# Patient Record
Sex: Female | Born: 1980 | Race: Black or African American | Hispanic: No | Marital: Married | Smoking: Never smoker
Health system: Southern US, Community
[De-identification: ages and names within clinical notes are randomized; demographics above are authoritative.]

## PROBLEM LIST (undated history)

## (undated) ENCOUNTER — Inpatient Hospital Stay (HOSPITAL_COMMUNITY): Payer: Self-pay

## (undated) DIAGNOSIS — O1410 Severe pre-eclampsia, unspecified trimester: Secondary | ICD-10-CM

## (undated) DIAGNOSIS — R9430 Abnormal result of cardiovascular function study, unspecified: Secondary | ICD-10-CM

## (undated) DIAGNOSIS — N83209 Unspecified ovarian cyst, unspecified side: Secondary | ICD-10-CM

## (undated) DIAGNOSIS — Z789 Other specified health status: Secondary | ICD-10-CM

## (undated) DIAGNOSIS — O1400 Mild to moderate pre-eclampsia, unspecified trimester: Secondary | ICD-10-CM

## (undated) DIAGNOSIS — I5032 Chronic diastolic (congestive) heart failure: Secondary | ICD-10-CM

## (undated) HISTORY — PX: OVARIAN CYST SURGERY: SHX726

## (undated) HISTORY — DX: Chronic diastolic (congestive) heart failure: I50.32

## (undated) HISTORY — DX: Abnormal result of cardiovascular function study, unspecified: R94.30

---

## 2010-04-08 ENCOUNTER — Emergency Department (HOSPITAL_BASED_OUTPATIENT_CLINIC_OR_DEPARTMENT_OTHER): Admission: EM | Admit: 2010-04-08 | Discharge: 2010-04-08 | Payer: Self-pay | Admitting: Emergency Medicine

## 2011-02-01 LAB — URINALYSIS, ROUTINE W REFLEX MICROSCOPIC
Bilirubin Urine: NEGATIVE
Hgb urine dipstick: NEGATIVE
Ketones, ur: NEGATIVE mg/dL
Nitrite: NEGATIVE
Protein, ur: NEGATIVE mg/dL
Specific Gravity, Urine: 1.029 (ref 1.005–1.030)
Urobilinogen, UA: 1 mg/dL (ref 0.0–1.0)

## 2011-02-01 LAB — PREGNANCY, URINE: Preg Test, Ur: NEGATIVE

## 2013-01-02 ENCOUNTER — Inpatient Hospital Stay (HOSPITAL_COMMUNITY)
Admission: AD | Admit: 2013-01-02 | Discharge: 2013-01-03 | Disposition: A | Discharge status: Home / Self Care | Payer: Medicaid Other | Source: Ambulatory Visit | Attending: Obstetrics and Gynecology | Admitting: Obstetrics and Gynecology

## 2013-01-02 DIAGNOSIS — N949 Unspecified condition associated with female genital organs and menstrual cycle: Secondary | ICD-10-CM | POA: Insufficient documentation

## 2013-01-02 DIAGNOSIS — O209 Hemorrhage in early pregnancy, unspecified: Secondary | ICD-10-CM

## 2013-01-02 DIAGNOSIS — O26859 Spotting complicating pregnancy, unspecified trimester: Secondary | ICD-10-CM | POA: Insufficient documentation

## 2013-01-02 HISTORY — DX: Other specified health status: Z78.9

## 2013-01-02 NOTE — MAU Note (Signed)
Pt presents with complaint of spotting (brown/red) off/on for 2 weeks. Denies pain

## 2013-01-03 ENCOUNTER — Encounter (HOSPITAL_COMMUNITY): Payer: Self-pay | Admitting: *Deleted

## 2013-01-03 ENCOUNTER — Inpatient Hospital Stay (HOSPITAL_COMMUNITY): Payer: Medicaid Other

## 2013-01-03 DIAGNOSIS — O4690 Antepartum hemorrhage, unspecified, unspecified trimester: Secondary | ICD-10-CM

## 2013-01-03 LAB — URINALYSIS, ROUTINE W REFLEX MICROSCOPIC
Bilirubin Urine: NEGATIVE
Leukocytes, UA: NEGATIVE
Nitrite: NEGATIVE
Specific Gravity, Urine: 1.02 (ref 1.005–1.030)
Urobilinogen, UA: 0.2 mg/dL (ref 0.0–1.0)
pH: 7 (ref 5.0–8.0)

## 2013-01-03 LAB — GC/CHLAMYDIA PROBE AMP: GC Probe RNA: NEGATIVE

## 2013-01-03 LAB — URINE MICROSCOPIC-ADD ON

## 2013-01-03 LAB — WET PREP, GENITAL

## 2013-01-03 LAB — POCT PREGNANCY, URINE: Preg Test, Ur: POSITIVE — AB

## 2013-01-03 NOTE — MAU Provider Note (Signed)
History     CSN: [id number]  Arrival date and time: 01/02/13 2324   First Provider Initiated Contact with Patient 01/03/13 0013      Chief Complaint  Patient presents with  . Vaginal Bleeding   HPI  Rachael Santiago is a 32 y.o. G1P0. She presents today with vaginal bleeding for about 3 weeks. She states that it is daily, and is only brown and spotty in nature. She states she has some pain, and she rates the pain 3/10.Marland Kitchen She states it is crampy in nature.   Past Medical History  Diagnosis Date  . Medical history non-contributory     Past Surgical History  Procedure Laterality Date  . Ovarian cyst surgery      History reviewed. No pertinent family history.  History  Substance Use Topics  . Smoking status: Never Smoker   . Smokeless tobacco: Never Used  . Alcohol Use: No    Allergies: No Known Allergies  Prescriptions prior to admission  Medication Sig Dispense Refill  . Prenatal Vit-Fe Fumarate-FA (MULTIVITAMIN-PRENATAL) 27-0.8 MG TABS Take 1 tablet by mouth daily.        Review of Systems  Constitutional: Negative for fever.  Eyes: Negative for blurred vision.  Gastrointestinal: Positive for nausea and abdominal pain (cramps). Negative for vomiting, diarrhea and constipation.  Genitourinary: Negative for dysuria, urgency and frequency.  Musculoskeletal: Negative for myalgias.  Neurological: Negative for dizziness and headaches.   Physical Exam   Blood pressure 115/54, pulse 71, temperature 98 F (36.7 C), temperature source Oral, resp. rate 18, height 5\' 3"  (1.6 m), weight 87.998 kg (194 lb), last menstrual period 10/19/2012, SpO2 100.00%.  Physical Exam  Nursing note and vitals reviewed. Constitutional: She is oriented to person, place, and time. She appears well-developed and well-nourished. No distress.  Cardiovascular: Normal rate.   Respiratory: Effort normal.  GI: Soft. She exhibits no distension. There is no tenderness.  Genitourinary:   External:  normal Vagina: small amount of brown blood seen in vault Cervix: pink, smooth, no CMT Uterus: AV.  Neurological: She is alert and oriented to person, place, and time.  Skin: Skin is warm and dry.  Psychiatric: She has a normal mood and affect.    MAU Course  Procedures  Results for orders placed during the hospital encounter of 01/02/13 (from the past 24 hour(s))  URINALYSIS, ROUTINE W REFLEX MICROSCOPIC     Status: Abnormal   Collection Time    01/02/13 11:53 PM      Result Value Range   Color, Urine YELLOW  YELLOW   APPearance CLEAR  CLEAR   Specific Gravity, Urine 1.020  1.005 - 1.030   pH 7.0  5.0 - 8.0   Glucose, UA NEGATIVE  NEGATIVE mg/dL   Hgb urine dipstick LARGE (*) NEGATIVE   Bilirubin Urine NEGATIVE  NEGATIVE   Ketones, ur NEGATIVE  NEGATIVE mg/dL   Protein, ur NEGATIVE  NEGATIVE mg/dL   Urobilinogen, UA 0.2  0.0 - 1.0 mg/dL   Nitrite NEGATIVE  NEGATIVE   Leukocytes, UA NEGATIVE  NEGATIVE  URINE MICROSCOPIC-ADD ON     Status: None   Collection Time    01/02/13 11:53 PM      Result Value Range   Squamous Epithelial / LPF RARE  RARE   WBC, UA 0-2  <3 WBC/hpf   RBC / HPF 0-2  <3 RBC/hpf   Urine-Other MUCOUS PRESENT    POCT PREGNANCY, URINE     Status: Abnormal   Collection Time  01/03/13 12:17 AM      Result Value Range   Preg Test, Ur POSITIVE (*) NEGATIVE  WET PREP, GENITAL     Status: Abnormal   Collection Time    01/03/13 12:30 AM      Result Value Range   Yeast Wet Prep HPF POC NONE SEEN  NONE SEEN   Trich, Wet Prep NONE SEEN  NONE SEEN   Clue Cells Wet Prep HPF POC FEW (*) NONE SEEN   WBC, Wet Prep HPF POC MODERATE (*) NONE SEEN   *RADIOLOGY REPORT*  Clinical Data: Pregnancy, bleeding  OBSTETRIC <14 WK ULTRASOUND  Technique: Transabdominal ultrasound was performed for evaluation  of the gestation as well as the maternal uterus and adnexal  regions.  Comparison: None.  Intrauterine gestational sac: Visualized single intrauterine  gestational  sac.  Yolk sac: Visualized  Embryo: Visualized  Cardiac Activity: Present  Heart Rate: 165 bpm  CRL: 33.1 mm 10 w 2 d [place] [facility]: July 30 2013  Maternal uterus/Adnexae: Unremarkable sonographic appearance of the  uterus and bilateral adnexa. No subchorionic hemorrhage.  IMPRESSION:  Viable single intrauterine gestation.  Original Report Authenticated By: Malachy Moan, M.D.    Assessment and Plan   1. Antepartum bleeding, first trimester    Reviewed first trimester danger signs Start prenatal care as soon as possible  Tawnya Crook 01/03/2013, 1:00 AM

## 2013-01-08 NOTE — MAU Provider Note (Signed)
Attestation of Attending Supervision of Advanced Practitioner (CNM/NP): Evaluation and management procedures were performed by the Advanced Practitioner under my supervision and collaboration.  I have reviewed the Advanced Practitioner's note and chart, and I agree with the management and plan.  Rino Hosea 01/08/2013 8:41 AM

## 2013-03-15 ENCOUNTER — Encounter (HOSPITAL_COMMUNITY): Payer: Self-pay | Admitting: Advanced Practice Midwife

## 2013-03-15 ENCOUNTER — Inpatient Hospital Stay (HOSPITAL_COMMUNITY)
Admission: AD | Admit: 2013-03-15 | Discharge: 2013-03-15 | Disposition: A | Discharge status: Home / Self Care | Payer: Medicaid Other | Source: Ambulatory Visit | Attending: Obstetrics and Gynecology | Admitting: Obstetrics and Gynecology

## 2013-03-15 DIAGNOSIS — O99891 Other specified diseases and conditions complicating pregnancy: Secondary | ICD-10-CM | POA: Insufficient documentation

## 2013-03-15 DIAGNOSIS — K920 Hematemesis: Secondary | ICD-10-CM | POA: Insufficient documentation

## 2013-03-15 DIAGNOSIS — O219 Vomiting of pregnancy, unspecified: Secondary | ICD-10-CM

## 2013-03-15 DIAGNOSIS — O210 Mild hyperemesis gravidarum: Secondary | ICD-10-CM | POA: Insufficient documentation

## 2013-03-15 LAB — URINALYSIS, ROUTINE W REFLEX MICROSCOPIC
Glucose, UA: NEGATIVE mg/dL
Ketones, ur: NEGATIVE mg/dL
Leukocytes, UA: NEGATIVE
Protein, ur: NEGATIVE mg/dL
pH: 6 (ref 5.0–8.0)

## 2013-03-15 LAB — URINE MICROSCOPIC-ADD ON

## 2013-03-15 MED ORDER — ONDANSETRON 8 MG PO TBDP
8.0000 mg | ORAL_TABLET | Freq: Once | ORAL | Status: AC
  Administered 2013-03-15: 8 mg via ORAL
  Filled 2013-03-15: qty 1

## 2013-03-15 MED ORDER — ESOMEPRAZOLE MAGNESIUM 40 MG PO CPDR: 40.0000 mg | DELAYED_RELEASE_CAPSULE | Freq: Every day | ORAL | Status: DC

## 2013-03-15 MED ORDER — FAMOTIDINE-CA CARB-MAG HYDROX 10-800-165 MG PO CHEW: 2.0000 | CHEWABLE_TABLET | Freq: Two times a day (BID) | ORAL | Status: DC | PRN

## 2013-03-15 MED ORDER — ONDANSETRON HCL 4 MG PO TABS: 4.0000 mg | ORAL_TABLET | ORAL | Status: DC | PRN

## 2013-03-15 NOTE — MAU Note (Addendum)
[place]  [place]   SHE WAS PUTTING SHEETS ON BED- SHE BEGAN GAGGING  AND THEN VOMITED.-  SAW BLOOD IN TOILET.     NOONE  ELSE IN HOUSE SICK.    WAS NOT NAUSEATED WHEN PUTTING ON SHEETS- BUT FEELS NAUSEATED NOW.  NO  DIARRHEA.     ATE  AT 730- RICE AND BOURBON CHICKEN AND THEN SHE ATE MORE AT 10PM.    SAYS SHE HAS TROUBLE WITH ACID REFLUX-     TAKES PREVACID- BUT THINKS MAKES IT WORSE.     LAST SEEN LAST AT OFFICE - LAST Monday.  NEXT APPOINTMENT   5-7.  LAST SEX- MORE  THAN 48 HRS AGO.  DOES NOT   HAVE ANY PHENERGAN  OR ZOFRAN FOR NAUSEA-  FROM OFFICE.

## 2013-03-15 NOTE — MAU Provider Note (Signed)
Chief Complaint:  No chief complaint on file.  First Provider Initiated Contact with Patient 03/15/13 0139     HPI: Rachael Santiago is a 32 y.o. G1P0 at [redacted]w[redacted]d who presents to maternity admissions reporting hematemesis x 1 tonight. Ate chicken dinner at ~7:30 pm and again at ~10:00 pm. Felt reflux while leaning over, gagged and vomited in toilet. Small amount of blood in emesis. Denies esophogeal/substernal/epigastric pain, further bleeding, contractions, leakage of fluid or vaginal bleeding. Good fetal movement. Mild nausea persists. Has not taken anything for it. No Hx of hematemesis.   Past Medical History: Past Medical History  Diagnosis Date  . Medical history non-contributory     Past obstetric history: OB History   Grav Para Term Preterm Abortions TAB SAB Ect Mult Living   1              # Outc Date GA Lbr Len/2nd Wgt Sex Del Anes PTL Lv   1 CUR               Past Surgical History: Past Surgical History  Procedure Laterality Date  . Ovarian cyst surgery      Family History: History reviewed. No pertinent family history.  Social History: History  Substance Use Topics  . Smoking status: Never Smoker   . Smokeless tobacco: Never Used  . Alcohol Use: No    Allergies: No Known Allergies  Meds:  Prescriptions prior to admission  Medication Sig Dispense Refill  . Prenatal Vit-Fe Fumarate-FA (MULTIVITAMIN-PRENATAL) 27-0.8 MG TABS Take 1 tablet by mouth daily.        ROS: Pertinent findings in history of present illness.  Physical Exam  Blood pressure 117/70, pulse 77, temperature 98.3 F (36.8 C), temperature source Oral, resp. rate 20, height 5\' 2"  (1.575 m), weight 97.07 kg (214 lb), last menstrual period 10/19/2012. GENERAL: Well-developed, well-nourished female in no acute distress.  HEENT: normocephalic HEART: normal rate RESP: normal effort ABDOMEN: Soft, non-tender, gravid appropriate for gestational age EXTREMITIES: Nontender, no edema NEURO: alert and  oriented SPECULUM EXAM: Deferred    FHT: 145 per doppler.   Labs: Results for orders placed during the hospital encounter of 03/15/13 (from the past 24 hour(s))  URINALYSIS, ROUTINE W REFLEX MICROSCOPIC     Status: Abnormal   Collection Time    03/15/13 12:52 AM      Result Value Range   Color, Urine YELLOW  YELLOW   APPearance CLEAR  CLEAR   Specific Gravity, Urine 1.025  1.005 - 1.030   pH 6.0  5.0 - 8.0   Glucose, UA NEGATIVE  NEGATIVE mg/dL   Hgb urine dipstick SMALL (*) NEGATIVE   Bilirubin Urine NEGATIVE  NEGATIVE   Ketones, ur NEGATIVE  NEGATIVE mg/dL   Protein, ur NEGATIVE  NEGATIVE mg/dL   Urobilinogen, UA 0.2  0.0 - 1.0 mg/dL   Nitrite NEGATIVE  NEGATIVE   Leukocytes, UA NEGATIVE  NEGATIVE  URINE MICROSCOPIC-ADD ON     Status: Abnormal   Collection Time    03/15/13 12:52 AM      Result Value Range   Squamous Epithelial / LPF RARE  RARE   WBC, UA 0-2  <3 WBC/hpf   RBC / HPF 3-6  <3 RBC/hpf   Bacteria, UA FEW (*) RARE    Imaging:  NA  MAU Course: 0150: No Testing needed at this time per Dr. Senaida Ores. Change to Nexium. Zofran given for nausea. No vomiting while in MAU. Tolerating POs.   Assessment: 1. Hematemesis  2. Nausea and vomiting in pregnancy prior to [redacted] weeks gestation    Plan: Discharge home Preterm labor precautions and fetal kick counts.     Follow-up Information   Follow up with BOVARD,JODY, MD In 1 week. (as scheduled)    Contact information:   [address] [place] [address] [phone]       Follow up with [facility]ERN[facility] ADMISS[facility]. (As needed if symptoms worsen)    Contact information:   [address] [place] [address] [phone]       Medication List    TAKE these medications       esomeprazole 40 MG capsule  Commonly known as:  NEXIUM  Take 1 capsule (40 mg total) by mouth daily.     famotidine-calcium carbonate-magnesium hydroxide 10-800-165 MG Chew   Commonly known as:  PEPCID COMPLETE  Chew 2 tablets by mouth 2 (two) times daily as needed.     multivitamin-prenatal 27-0.8 MG Tabs  Take 1 tablet by mouth daily.     ondansetron 4 MG tablet  Commonly known as:  ZOFRAN  Take 1 tablet (4 mg total) by mouth every 4 (four) hours as needed for nausea.        [place], CNM 03/15/2013 1:59 AM

## 2013-05-01 ENCOUNTER — Inpatient Hospital Stay (HOSPITAL_COMMUNITY)
Admission: AD | Admit: 2013-05-01 | Discharge: 2013-05-10 | DRG: 765 | Disposition: A | Discharge status: Home / Self Care | Payer: Medicaid Other | Source: Ambulatory Visit | Attending: Obstetrics and Gynecology | Admitting: Obstetrics and Gynecology

## 2013-05-01 ENCOUNTER — Other Ambulatory Visit: Payer: Self-pay | Admitting: Obstetrics and Gynecology

## 2013-05-01 ENCOUNTER — Inpatient Hospital Stay (HOSPITAL_COMMUNITY): Payer: Medicaid Other

## 2013-05-01 ENCOUNTER — Encounter (HOSPITAL_COMMUNITY): Payer: Self-pay

## 2013-05-01 DIAGNOSIS — D259 Leiomyoma of uterus, unspecified: Secondary | ICD-10-CM | POA: Diagnosis present

## 2013-05-01 DIAGNOSIS — D4959 Neoplasm of unspecified behavior of other genitourinary organ: Secondary | ICD-10-CM | POA: Diagnosis present

## 2013-05-01 DIAGNOSIS — I313 Pericardial effusion (noninflammatory): Secondary | ICD-10-CM

## 2013-05-01 DIAGNOSIS — O1400 Mild to moderate pre-eclampsia, unspecified trimester: Secondary | ICD-10-CM

## 2013-05-01 DIAGNOSIS — IMO0002 Reserved for concepts with insufficient information to code with codable children: Principal | ICD-10-CM | POA: Diagnosis present

## 2013-05-01 DIAGNOSIS — O1413 Severe pre-eclampsia, third trimester: Secondary | ICD-10-CM

## 2013-05-01 DIAGNOSIS — I319 Disease of pericardium, unspecified: Secondary | ICD-10-CM | POA: Diagnosis present

## 2013-05-01 DIAGNOSIS — I340 Nonrheumatic mitral (valve) insufficiency: Secondary | ICD-10-CM

## 2013-05-01 DIAGNOSIS — O34599 Maternal care for other abnormalities of gravid uterus, unspecified trimester: Secondary | ICD-10-CM | POA: Diagnosis present

## 2013-05-01 DIAGNOSIS — I3139 Other pericardial effusion (noninflammatory): Secondary | ICD-10-CM

## 2013-05-01 DIAGNOSIS — I509 Heart failure, unspecified: Secondary | ICD-10-CM

## 2013-05-01 DIAGNOSIS — I059 Rheumatic mitral valve disease, unspecified: Secondary | ICD-10-CM | POA: Diagnosis present

## 2013-05-01 DIAGNOSIS — O1410 Severe pre-eclampsia, unspecified trimester: Secondary | ICD-10-CM

## 2013-05-01 HISTORY — DX: Mild to moderate pre-eclampsia, unspecified trimester: O14.00

## 2013-05-01 HISTORY — DX: Unspecified ovarian cyst, unspecified side: N83.209

## 2013-05-01 HISTORY — DX: Severe pre-eclampsia, unspecified trimester: O14.10

## 2013-05-01 LAB — URINALYSIS, ROUTINE W REFLEX MICROSCOPIC
Bilirubin Urine: NEGATIVE
Ketones, ur: NEGATIVE mg/dL
Leukocytes, UA: NEGATIVE
Nitrite: NEGATIVE
Specific Gravity, Urine: 1.02 (ref 1.005–1.030)
Urobilinogen, UA: 0.2 mg/dL (ref 0.0–1.0)

## 2013-05-01 LAB — COMPREHENSIVE METABOLIC PANEL
ALT: 29 U/L (ref 0–35)
AST: 35 U/L (ref 0–37)
Albumin: 1.9 g/dL — ABNORMAL LOW (ref 3.5–5.2)
Calcium: 8.8 mg/dL (ref 8.4–10.5)
Potassium: 3.8 mEq/L (ref 3.5–5.1)
Sodium: 135 mEq/L (ref 135–145)
Total Protein: 5.1 g/dL — ABNORMAL LOW (ref 6.0–8.3)

## 2013-05-01 LAB — CBC
MCH: 27.7 pg (ref 26.0–34.0)
MCHC: 33.2 g/dL (ref 30.0–36.0)
Platelets: 190 10*3/uL (ref 150–400)

## 2013-05-01 LAB — URINE MICROSCOPIC-ADD ON

## 2013-05-01 MED ORDER — LABETALOL HCL 5 MG/ML IV SOLN
10.0000 mg | INTRAVENOUS | Status: DC | PRN
  Administered 2013-05-01 – 2013-05-02 (×5): 10 mg via INTRAVENOUS
  Filled 2013-05-01 (×2): qty 4

## 2013-05-01 MED ORDER — LACTATED RINGERS IV SOLN
INTRAVENOUS | Status: DC
  Administered 2013-05-01: 20 mL via INTRAVENOUS

## 2013-05-01 MED ORDER — DOCUSATE SODIUM 100 MG PO CAPS
100.0000 mg | ORAL_CAPSULE | Freq: Every day | ORAL | Status: DC
  Administered 2013-05-01 – 2013-05-04 (×4): 100 mg via ORAL
  Filled 2013-05-01 (×5): qty 1

## 2013-05-01 MED ORDER — PRENATAL MULTIVITAMIN CH
1.0000 | ORAL_TABLET | Freq: Every day | ORAL | Status: DC
  Administered 2013-05-02 – 2013-05-04 (×3): 1 via ORAL
  Filled 2013-05-01 (×4): qty 1

## 2013-05-01 MED ORDER — ZOLPIDEM TARTRATE 5 MG PO TABS: 5.0000 mg | ORAL_TABLET | Freq: Every evening | ORAL | Status: DC | PRN

## 2013-05-01 MED ORDER — SODIUM CHLORIDE 0.9 % IV SOLN
Freq: Once | INTRAVENOUS | Status: AC
  Administered 2013-05-01: 16:00:00 via INTRAVENOUS

## 2013-05-01 MED ORDER — BETAMETHASONE SOD PHOS & ACET 6 (3-3) MG/ML IJ SUSP
12.0000 mg | Freq: Once | INTRAMUSCULAR | Status: AC
  Administered 2013-05-01: 12 mg via INTRAMUSCULAR
  Filled 2013-05-01: qty 2

## 2013-05-01 MED ORDER — LABETALOL HCL 5 MG/ML IV SOLN
INTRAVENOUS | Status: AC
  Filled 2013-05-01: qty 4

## 2013-05-01 MED ORDER — CALCIUM CARBONATE ANTACID 500 MG PO CHEW: 2.0000 | CHEWABLE_TABLET | ORAL | Status: DC | PRN

## 2013-05-01 MED ORDER — LABETALOL HCL 5 MG/ML IV SOLN
10.0000 mg | Freq: Once | INTRAVENOUS | Status: AC
  Administered 2013-05-01: 10 mg via INTRAVENOUS
  Filled 2013-05-01: qty 4

## 2013-05-01 NOTE — Progress Notes (Signed)
walidah cnm notified of patient, her elevated bp,. Will see patient ASAP.

## 2013-05-01 NOTE — H&P (Signed)
Rachael Santiago, Rachael Santiago NO.:  [id number]  MEDICAL RECORD NO.:  [id number]  LOCATION:  9164                          FACILITY:  [facility]  PHYSICIAN:  Malachi Pro. Ambrose Mantle, M.D. DATE OF BIRTH:  July 10, 1981  DATE OF ADMISSION:  05/01/2013 DATE OF DISCHARGE:                             HISTORY & PHYSICAL   PRESENT ILLNESS:  This is a 32 year old black female, para 0, gravida 1 at 27 weeks and 1 day gestation, who is admitted with probable preeclampsia.  The patient has a negative hepatitis B surface antigen, positive rubella, O positive blood group and type with a negative antibody screen.  RPR nonreactive.  Quad screen negative.  Pap smear normal.  One-hour Glucola 122.  This patient was late to prenatal care beginning her prenatal care at 19 weeks.  She was having a normal regular prenatal visit today and was found to have 4+ protein in the urine.  Blood pressures were elevated at around 150-158/100.  She was sent to the maternity admission unit where blood pressures were even higher.  Blood labs including SGOT and PT and platelet count were normal.  However, she again showed significant proteinuria.  The blood pressure was treated with labetalol 10 mg IV, it became more normal and she is admitted now for steroid therapy, close monitoring of her blood pressure and NICU consult.  PAST MEDICAL HISTORY:  Reveals no known allergies.  No significant medical illnesses.  OPERATIONS:  She did have laparoscopic removal of an ovarian cyst.  SOCIAL HISTORY:  She does not drink, take drugs or smoke tobacco.  PHYSICAL EXAMINATION:  VITAL SIGNS:  On admission, temperature 98.8, pulse 102, respirations 20, blood pressure 180/102, previous was 150/98, O2 saturation 99. HEART:  Normal size and sounds.  No murmurs. LUNGS:  Clear to auscultation. ABDOMEN:  Fundal height appropriate for gestational age.  Fetal heart tones are normal.  Cervix not examined.  There is 2 to 3+  pretibial edema.  ADMITTING IMPRESSION:  Intrauterine pregnancy, 27 weeks and 1 day, presumed preeclampsia.  The patient is admitted for tight blood pressure control, administration of steroids and NICU consult.     Malachi Pro. Ambrose Mantle, M.D.     TFH/MEDQ  D:  05/01/2013  T:  05/01/2013  Job:  [id number]

## 2013-05-01 NOTE — MAU Note (Signed)
Patient is sent from the office by dr meisinger due to elevated bp. Patient states that this the first time that her bp is high and had protein in her urine. She denies any visual disturbance, epigastric pain, vaginal bleeding, lof or contractions. She reports having a mild 3/10 headache and good fetal movement.

## 2013-05-01 NOTE — MAU Provider Note (Signed)
  History     CSN: [id number]  Arrival date and time: 05/01/13 1526   First Provider Initiated Contact with Patient 05/01/13 1608      Chief Complaint  Patient presents with  . Hypertension   HPI  Pt is a G1P0 sent from office with elevated blood pressure.  Reports headache (frontal) that started two hours ago.  Pt states that it is "mild".  No report or epigastric pain or vision changes.  +fetal movement.  No contractions, leaking of fluid, or vaginal bleeding.    Past Medical History  Diagnosis Date  . Medical history non-contributory   . Ovarian cyst     Past Surgical History  Procedure Laterality Date  . Ovarian cyst surgery      History reviewed. No pertinent family history.  History  Substance Use Topics  . Smoking status: Never Smoker   . Smokeless tobacco: Never Used  . Alcohol Use: No    Allergies: No Known Allergies  Prescriptions prior to admission  Medication Sig Dispense Refill  . famotidine-calcium carbonate-magnesium hydroxide (PEPCID COMPLETE) 10-800-165 MG CHEW Chew 2 tablets by mouth 2 (two) times daily as needed.  60 tablet  4  . ondansetron (ZOFRAN) 4 MG tablet Take 1 tablet (4 mg total) by mouth every 4 (four) hours as needed for nausea.  30 tablet  0  . Prenatal Vit-Fe Fumarate-FA (MULTIVITAMIN-PRENATAL) 27-0.8 MG TABS Take 1 tablet by mouth daily.        Review of Systems  Eyes: Negative.   Gastrointestinal: Negative for abdominal pain.  Neurological: Positive for headaches.  All other systems reviewed and are negative.   Physical Exam   Blood pressure 175/110, pulse 102, temperature 98.7 F (37.1 C), temperature source Oral, resp. rate 18, height 5\' 3"  (1.6 m), weight 105.688 kg (233 lb), last menstrual period 10/19/2012, SpO2 100.00%.  Physical Exam  Constitutional: She is oriented to person, place, and time. She appears well-developed and well-nourished. No distress.  HENT:  Head: Normocephalic.  Eyes: Pupils are equal, round, and  reactive to light.  Neck: Normal range of motion. Neck supple.  Cardiovascular: Normal rate and regular rhythm.   Respiratory: Effort normal and breath sounds normal.  Genitourinary: No bleeding around the vagina.  Musculoskeletal: Normal range of motion. She exhibits edema ( 2+pedal edema).     Neurological: She is alert and oriented to person, place, and time. She has normal reflexes. She displays normal reflexes.  Skin: Skin is warm and dry.    MAU Course  Procedures  [address] > IV Labe 10 mg  Care turned over to Dr. Ambrose Mantle who admits pt for preeclampsia.  Assessment and Plan    [facility] 05/01/2013, 4:10 PM

## 2013-05-02 LAB — CBC WITH DIFFERENTIAL/PLATELET
Eosinophils Relative: 0 % (ref 0–5)
HCT: 30.3 % — ABNORMAL LOW (ref 36.0–46.0)
Lymphocytes Relative: 10 % — ABNORMAL LOW (ref 12–46)
Lymphs Abs: 0.9 10*3/uL (ref 0.7–4.0)
MCV: 83.7 fL (ref 78.0–100.0)
Neutro Abs: 8.1 10*3/uL — ABNORMAL HIGH (ref 1.7–7.7)
Platelets: 192 10*3/uL (ref 150–400)
RBC: 3.62 MIL/uL — ABNORMAL LOW (ref 3.87–5.11)
WBC: 9.1 10*3/uL (ref 4.0–10.5)

## 2013-05-02 LAB — COMPREHENSIVE METABOLIC PANEL
ALT: 30 U/L (ref 0–35)
AST: 39 U/L — ABNORMAL HIGH (ref 0–37)
Alkaline Phosphatase: 96 U/L (ref 39–117)
CO2: 22 mEq/L (ref 19–32)
Calcium: 8.7 mg/dL (ref 8.4–10.5)
Chloride: 105 mEq/L (ref 96–112)
GFR calc Af Amer: >90 mL/min (ref >90)
GFR calc non Af Amer: >90 mL/min (ref >90)
Glucose, Bld: 138 mg/dL — ABNORMAL HIGH (ref 70–99)
Sodium: 135 mEq/L (ref 135–145)
Total Bilirubin: 0.1 mg/dL — ABNORMAL LOW (ref 0.3–1.2)

## 2013-05-02 LAB — OB RESULTS CONSOLE HEPATITIS B SURFACE ANTIGEN: Hepatitis B Surface Ag: NEGATIVE

## 2013-05-02 LAB — URINE CULTURE

## 2013-05-02 LAB — OB RESULTS CONSOLE ABO/RH: RH Type: POSITIVE

## 2013-05-02 LAB — OB RESULTS CONSOLE RUBELLA ANTIBODY, IGM: Rubella: IMMUNE

## 2013-05-02 LAB — OB RESULTS CONSOLE ANTIBODY SCREEN: Antibody Screen: NEGATIVE

## 2013-05-02 LAB — OB RESULTS CONSOLE RPR: RPR: NONREACTIVE

## 2013-05-02 MED ORDER — LABETALOL HCL 100 MG PO TABS
100.0000 mg | ORAL_TABLET | Freq: Two times a day (BID) | ORAL | Status: DC
  Administered 2013-05-02 – 2013-05-05 (×7): 100 mg via ORAL
  Filled 2013-05-02 (×9): qty 1

## 2013-05-02 MED ORDER — BETAMETHASONE SOD PHOS & ACET 6 (3-3) MG/ML IJ SUSP
12.0000 mg | Freq: Once | INTRAMUSCULAR | Status: AC
  Administered 2013-05-02: 12 mg via INTRAMUSCULAR
  Filled 2013-05-02: qty 2

## 2013-05-02 NOTE — Progress Notes (Signed)
Feels ok Afeb, VSS, BP 140-150/80-90 today on PO labetalol FHT- reassuring when on monitor Will continue Labetalol, recheck labs in am, finish 24 hour urine collection, second BMZ tonight.  Patient is aware that her status could change any day and we may need to proceed with delivery.

## 2013-05-02 NOTE — Plan of Care (Signed)
Problem: Consults Goal: Birthing Suites Patient Information Press F2 to bring up selections list  Outcome: Completed/Met Date Met:  05/02/13  Pt < [redacted] weeks EGA

## 2013-05-02 NOTE — Progress Notes (Signed)
N Dr. Jackelyn Knife of difficulty obtaining cont FHR tracing despite mult attempts by different RNs. Also made pt aware becoming increasingly unhappy about positioning req to obtain tracing. Will now allow pt to be NST qshift

## 2013-05-02 NOTE — Progress Notes (Signed)
Patient ID: Rachael Santiago, female   DOB: 1981/09/09, 32 y.o.   MRN: [id number] 24 hour urine in progress She has received the first dose of steroids. BP controlled with intermittent doses of labetalol. [place] suggests 1 pound 15 ounce fetus that is vertex and AFI subjectively normal Pt has no headache

## 2013-05-02 NOTE — Progress Notes (Signed)
Pt req to wait until after food comes and she has eaten before monitoring. Will call out when ready.

## 2013-05-02 NOTE — Consult Note (Signed)
Asked by Dr Ambrose Mantle to speak to Rachael Santiago re: prenatal consult at 27 weeks with suspected preeclampsia. Chart reviewed. Briefly, she is at [redacted] wks gestation with elevated BP on labetalol.  She has received her second dose of betamethasone tonight. EFW 862 gms, 25%.  Rachael Santiago's mom was present in the room. According to her, Rachael Santiago was a preterm ~29 wks but can't remember BW and complications.  I spoke to Rachael Santiago and discussed resuscitation, general outcome and morbidities around this gestation. Discussed RDS, vent support, nutritional support, immune system immaturity and GI immaturity. Discussed umbilical lines, HAL,  temp support, and LOS.  I also discussed benefits of breastfeeding which Rachael Santiago would like to do.  Thank you for this consult  Total time: 40 min including 35 min face to face.  [name] Q

## 2013-05-02 NOTE — Progress Notes (Signed)
Ask pt about monitoring, req to wait until vistors leave

## 2013-05-03 ENCOUNTER — Encounter (HOSPITAL_COMMUNITY): Payer: Self-pay | Admitting: Obstetrics and Gynecology

## 2013-05-03 DIAGNOSIS — O1410 Severe pre-eclampsia, unspecified trimester: Secondary | ICD-10-CM

## 2013-05-03 DIAGNOSIS — O1400 Mild to moderate pre-eclampsia, unspecified trimester: Secondary | ICD-10-CM

## 2013-05-03 HISTORY — DX: Mild to moderate pre-eclampsia, unspecified trimester: O14.00

## 2013-05-03 HISTORY — DX: Severe pre-eclampsia, unspecified trimester: O14.10

## 2013-05-03 LAB — COMPREHENSIVE METABOLIC PANEL
AST: 39 U/L — ABNORMAL HIGH (ref 0–37)
BUN: 17 mg/dL (ref 6–23)
CO2: 21 mEq/L (ref 19–32)
Chloride: 107 mEq/L (ref 96–112)
Creatinine, Ser: 0.84 mg/dL (ref 0.50–1.10)
GFR calc Af Amer: >90 mL/min (ref >90)
GFR calc non Af Amer: >90 mL/min (ref >90)
Glucose, Bld: 148 mg/dL — ABNORMAL HIGH (ref 70–99)
Total Bilirubin: 0.1 mg/dL — ABNORMAL LOW (ref 0.3–1.2)

## 2013-05-03 LAB — CREATININE CLEARANCE, URINE, 24 HOUR
Collection Interval-CRCL: 24 hours
Creatinine Clearance: 120 mL/min — ABNORMAL HIGH (ref 75–115)
Urine Total Volume-CRCL: 525 mL

## 2013-05-03 LAB — CBC
HCT: 28.1 % — ABNORMAL LOW (ref 36.0–46.0)
Hemoglobin: 9.4 g/dL — ABNORMAL LOW (ref 12.0–15.0)
MCV: 84.6 fL (ref 78.0–100.0)
RBC: 3.32 MIL/uL — ABNORMAL LOW (ref 3.87–5.11)
WBC: 16.6 10*3/uL — ABNORMAL HIGH (ref 4.0–10.5)

## 2013-05-03 LAB — PROTEIN, URINE, 24 HOUR
Protein, 24H Urine: 14264 mg/d — ABNORMAL HIGH (ref 50–100)
Protein, Urine: 2717 mg/dL

## 2013-05-03 NOTE — Progress Notes (Addendum)
Patient ID: Rachael Santiago, female   DOB: [birth year], 32 y.o.   MRN: [id number]   On review, 14gm protein. Severe PreE Will monitor closely  D/W MFM, will wait for BP changes, lab changes, etc.  Daily CBC, CMP monitor LFTs and Plts and Cr.

## 2013-05-03 NOTE — Progress Notes (Signed)
Patient ID: Rachael Santiago, female   DOB: [birth year], 32 y.o.   MRN: [id number]  No c/o's.  +FM, no LOF, no VB, occ ctx, no PIH sx's  AF VSS  Gen NAD Abd soft, FNT FHTs 150's, mod var toco none  24hr urine 2.7gm protein S/p bmz x 2 Cont close monitoring Thanks to NICU for consult daily CMP, CBC - mildly elevated AST D/w pt POC

## 2013-05-04 ENCOUNTER — Encounter (HOSPITAL_COMMUNITY): Payer: Self-pay | Admitting: *Deleted

## 2013-05-04 LAB — COMPREHENSIVE METABOLIC PANEL
AST: 32 U/L (ref 0–37)
Albumin: 1.6 g/dL — ABNORMAL LOW (ref 3.5–5.2)
Alkaline Phosphatase: 85 U/L (ref 39–117)
BUN: 16 mg/dL (ref 6–23)
Chloride: 108 mEq/L (ref 96–112)
Potassium: 4.7 mEq/L (ref 3.5–5.1)
Total Bilirubin: 0.1 mg/dL — ABNORMAL LOW (ref 0.3–1.2)

## 2013-05-04 LAB — TYPE AND SCREEN
ABO/RH(D): O POS
Antibody Screen: NEGATIVE
Antibody Screen: NEGATIVE

## 2013-05-04 LAB — CBC WITH DIFFERENTIAL/PLATELET
Basophils Absolute: 0 10*3/uL (ref 0.0–0.1)
Basophils Relative: 0 % (ref 0–1)
Hemoglobin: 9 g/dL — ABNORMAL LOW (ref 12.0–15.0)
MCHC: 32.4 g/dL (ref 30.0–36.0)
Monocytes Relative: 10 % (ref 3–12)
Neutro Abs: 11.5 10*3/uL — ABNORMAL HIGH (ref 1.7–7.7)
Neutrophils Relative %: 74 % (ref 43–77)
WBC: 15.7 10*3/uL — ABNORMAL HIGH (ref 4.0–10.5)

## 2013-05-04 LAB — CULTURE, BETA STREP (GROUP B ONLY)

## 2013-05-04 NOTE — Progress Notes (Signed)
Patient ID: Rachael Santiago, female   DOB: 09/27/1981, 32 y.o.   MRN: [id number]  No c/o's.  +FM, no LOF, no VB, occ ctx; no PIH sx's  AF VSS (BP 130-160/90's) gen NAD Abd soft, NT FHTs strip q shift, 140-150's mod var  S/p BMZ 24hr urine with 14 gm protein Close monitoring

## 2013-05-05 ENCOUNTER — Inpatient Hospital Stay (HOSPITAL_COMMUNITY): Payer: Medicaid Other

## 2013-05-05 ENCOUNTER — Inpatient Hospital Stay (HOSPITAL_COMMUNITY): Payer: Medicaid Other | Admitting: Anesthesiology

## 2013-05-05 ENCOUNTER — Encounter (HOSPITAL_COMMUNITY)
Admission: AD | Disposition: A | Discharge status: Home / Self Care | Payer: Self-pay | Source: Ambulatory Visit | Attending: Obstetrics and Gynecology

## 2013-05-05 ENCOUNTER — Encounter (HOSPITAL_COMMUNITY): Payer: Self-pay | Admitting: Anesthesiology

## 2013-05-05 DIAGNOSIS — I319 Disease of pericardium, unspecified: Secondary | ICD-10-CM

## 2013-05-05 DIAGNOSIS — IMO0002 Reserved for concepts with insufficient information to code with codable children: Secondary | ICD-10-CM

## 2013-05-05 DIAGNOSIS — I313 Pericardial effusion (noninflammatory): Secondary | ICD-10-CM

## 2013-05-05 DIAGNOSIS — I509 Heart failure, unspecified: Secondary | ICD-10-CM

## 2013-05-05 DIAGNOSIS — I059 Rheumatic mitral valve disease, unspecified: Secondary | ICD-10-CM

## 2013-05-05 DIAGNOSIS — I3139 Other pericardial effusion (noninflammatory): Secondary | ICD-10-CM

## 2013-05-05 DIAGNOSIS — I340 Nonrheumatic mitral (valve) insufficiency: Secondary | ICD-10-CM

## 2013-05-05 LAB — COMPREHENSIVE METABOLIC PANEL
ALT: 25 U/L (ref 0–35)
AST: 34 U/L (ref 0–37)
Albumin: 1.6 g/dL — ABNORMAL LOW (ref 3.5–5.2)
Calcium: 8 mg/dL — ABNORMAL LOW (ref 8.4–10.5)
Creatinine, Ser: 0.74 mg/dL (ref 0.50–1.10)
GFR calc non Af Amer: >90 mL/min (ref >90)
Sodium: 137 mEq/L (ref 135–145)
Total Protein: 4.1 g/dL — ABNORMAL LOW (ref 6.0–8.3)

## 2013-05-05 LAB — CBC WITH DIFFERENTIAL/PLATELET
Basophils Absolute: 0 10*3/uL (ref 0.0–0.1)
Basophils Relative: 0 % (ref 0–1)
Eosinophils Absolute: 0 10*3/uL (ref 0.0–0.7)
Eosinophils Relative: 0 % (ref 0–5)
Lymphs Abs: 2.6 10*3/uL (ref 0.7–4.0)
MCH: 28.3 pg (ref 26.0–34.0)
MCHC: 33.2 g/dL (ref 30.0–36.0)
MCV: 85.3 fL (ref 78.0–100.0)
Neutrophils Relative %: 71 % (ref 43–77)
Platelets: 187 10*3/uL (ref 150–400)
RBC: 3.39 MIL/uL — ABNORMAL LOW (ref 3.87–5.11)
RDW: 15 % (ref 11.5–15.5)

## 2013-05-05 LAB — CBC
MCV: 85.2 fL (ref 78.0–100.0)
Platelets: 185 10*3/uL (ref 150–400)
RDW: 15.1 % (ref 11.5–15.5)
WBC: 14.8 10*3/uL — ABNORMAL HIGH (ref 4.0–10.5)

## 2013-05-05 SURGERY — Surgical Case
Anesthesia: Epidural | Site: Abdomen | Wound class: Clean Contaminated

## 2013-05-05 MED ORDER — TETANUS-DIPHTH-ACELL PERTUSSIS 5-2.5-18.5 LF-MCG/0.5 IM SUSP
0.5000 mL | Freq: Once | INTRAMUSCULAR | Status: DC
  Filled 2013-05-05: qty 0.5

## 2013-05-05 MED ORDER — ONDANSETRON HCL 4 MG PO TABS: 4.0000 mg | ORAL_TABLET | ORAL | Status: DC | PRN

## 2013-05-05 MED ORDER — NALOXONE HCL 1 MG/ML IJ SOLN: 1.0000 ug/kg/h | INTRAVENOUS | Status: DC | PRN

## 2013-05-05 MED ORDER — OXYTOCIN 10 UNIT/ML IJ SOLN
INTRAMUSCULAR | Status: AC
  Filled 2013-05-05: qty 2

## 2013-05-05 MED ORDER — ONDANSETRON HCL 4 MG/2ML IJ SOLN
INTRAMUSCULAR | Status: AC
  Filled 2013-05-05: qty 2

## 2013-05-05 MED ORDER — LABETALOL HCL 5 MG/ML IV SOLN
INTRAVENOUS | Status: AC
  Filled 2013-05-05: qty 8

## 2013-05-05 MED ORDER — FUROSEMIDE 10 MG/ML IJ SOLN
40.0000 mg | Freq: Once | INTRAMUSCULAR | Status: DC
  Filled 2013-05-05: qty 4

## 2013-05-05 MED ORDER — BENZOCAINE-MENTHOL 20-0.5 % EX AERO: 1.0000 "application " | INHALATION_SPRAY | CUTANEOUS | Status: DC | PRN

## 2013-05-05 MED ORDER — SIMETHICONE 80 MG PO CHEW
80.0000 mg | CHEWABLE_TABLET | Freq: Three times a day (TID) | ORAL | Status: DC
  Administered 2013-05-05 – 2013-05-09 (×14): 80 mg via ORAL

## 2013-05-05 MED ORDER — SENNOSIDES-DOCUSATE SODIUM 8.6-50 MG PO TABS: 2.0000 | ORAL_TABLET | Freq: Every day | ORAL | Status: DC

## 2013-05-05 MED ORDER — LACTATED RINGERS IV SOLN: INTRAVENOUS | Status: DC

## 2013-05-05 MED ORDER — ONDANSETRON HCL 4 MG/2ML IJ SOLN
INTRAMUSCULAR | Status: DC | PRN
  Administered 2013-05-05: 4 mg via INTRAVENOUS

## 2013-05-05 MED ORDER — LACTATED RINGERS IV SOLN
INTRAVENOUS | Status: DC
  Administered 2013-05-07: 03:00:00 via INTRAVENOUS

## 2013-05-05 MED ORDER — DIBUCAINE 1 % RE OINT: 1.0000 "application " | TOPICAL_OINTMENT | RECTAL | Status: DC | PRN

## 2013-05-05 MED ORDER — MAGNESIUM SULFATE BOLUS VIA INFUSION
4.0000 g | Freq: Once | INTRAVENOUS | Status: AC
  Administered 2013-05-05: 4 g via INTRAVENOUS
  Filled 2013-05-05: qty 500

## 2013-05-05 MED ORDER — MAGNESIUM SULFATE 40 G IN LACTATED RINGERS - SIMPLE
2.0000 g/h | INTRAVENOUS | Status: DC
  Administered 2013-05-05 – 2013-05-06 (×2): 2 g/h via INTRAVENOUS
  Filled 2013-05-05 (×2): qty 500

## 2013-05-05 MED ORDER — SODIUM CHLORIDE 0.9 % IJ SOLN: 3.0000 mL | INTRAMUSCULAR | Status: DC | PRN

## 2013-05-05 MED ORDER — ONDANSETRON HCL 4 MG/2ML IJ SOLN: 4.0000 mg | INTRAMUSCULAR | Status: DC | PRN

## 2013-05-05 MED ORDER — SCOPOLAMINE 1 MG/3DAYS TD PT72: 1.0000 | MEDICATED_PATCH | Freq: Once | TRANSDERMAL | Status: AC

## 2013-05-05 MED ORDER — OXYTOCIN 10 UNIT/ML IJ SOLN
40.0000 [IU] | INTRAVENOUS | Status: DC | PRN
  Administered 2013-05-05: 40 [IU] via INTRAVENOUS

## 2013-05-05 MED ORDER — SODIUM BICARBONATE 8.4 % IV SOLN
INTRAVENOUS | Status: DC | PRN
  Administered 2013-05-05: 3 mL via EPIDURAL

## 2013-05-05 MED ORDER — DIPHENHYDRAMINE HCL 50 MG/ML IJ SOLN: 25.0000 mg | INTRAMUSCULAR | Status: DC | PRN

## 2013-05-05 MED ORDER — ZOLPIDEM TARTRATE 5 MG PO TABS: 5.0000 mg | ORAL_TABLET | Freq: Every evening | ORAL | Status: DC | PRN

## 2013-05-05 MED ORDER — DIPHENHYDRAMINE HCL 50 MG/ML IJ SOLN: 12.5000 mg | INTRAMUSCULAR | Status: DC | PRN

## 2013-05-05 MED ORDER — WITCH HAZEL-GLYCERIN EX PADS: 1.0000 "application " | MEDICATED_PAD | CUTANEOUS | Status: DC | PRN

## 2013-05-05 MED ORDER — PHENYLEPHRINE 40 MCG/ML (10ML) SYRINGE FOR IV PUSH (FOR BLOOD PRESSURE SUPPORT)
PREFILLED_SYRINGE | INTRAVENOUS | Status: AC
  Filled 2013-05-05: qty 5

## 2013-05-05 MED ORDER — CITRIC ACID-SODIUM CITRATE 334-500 MG/5ML PO SOLN
ORAL | Status: AC
  Filled 2013-05-05: qty 15

## 2013-05-05 MED ORDER — NALBUPHINE HCL 10 MG/ML IJ SOLN
5.0000 mg | INTRAMUSCULAR | Status: DC | PRN
  Filled 2013-05-05: qty 1

## 2013-05-05 MED ORDER — SIMETHICONE 80 MG PO CHEW: 80.0000 mg | CHEWABLE_TABLET | ORAL | Status: DC | PRN

## 2013-05-05 MED ORDER — MEPERIDINE HCL 25 MG/ML IJ SOLN: 6.2500 mg | INTRAMUSCULAR | Status: DC | PRN

## 2013-05-05 MED ORDER — MORPHINE SULFATE (PF) 0.5 MG/ML IJ SOLN
INTRAMUSCULAR | Status: DC | PRN
  Administered 2013-05-05: 4 mg via EPIDURAL

## 2013-05-05 MED ORDER — FAMOTIDINE 20 MG PO TABS: 20.0000 mg | ORAL_TABLET | Freq: Two times a day (BID) | ORAL | Status: DC | PRN

## 2013-05-05 MED ORDER — TETANUS-DIPHTH-ACELL PERTUSSIS 5-2.5-18.5 LF-MCG/0.5 IM SUSP
0.5000 mL | Freq: Once | INTRAMUSCULAR | Status: AC
  Administered 2013-05-06: 0.5 mL via INTRAMUSCULAR
  Filled 2013-05-05: qty 0.5

## 2013-05-05 MED ORDER — LABETALOL HCL 5 MG/ML IV SOLN
INTRAVENOUS | Status: AC
  Administered 2013-05-05: 10 mg via INTRAVENOUS
  Filled 2013-05-05: qty 4

## 2013-05-05 MED ORDER — SODIUM CHLORIDE 0.9 % IR SOLN
Status: DC | PRN
  Administered 2013-05-05: 1000 mL

## 2013-05-05 MED ORDER — OXYTOCIN 40 UNITS IN LACTATED RINGERS INFUSION - SIMPLE MED: 62.5000 mL/h | INTRAVENOUS | Status: AC

## 2013-05-05 MED ORDER — SODIUM CHLORIDE 0.9 % IJ SOLN
INTRAMUSCULAR | Status: AC
  Filled 2013-05-05: qty 3

## 2013-05-05 MED ORDER — FENTANYL CITRATE 0.05 MG/ML IJ SOLN
INTRAMUSCULAR | Status: AC
  Administered 2013-05-05: 50 ug via INTRAVENOUS
  Filled 2013-05-05: qty 2

## 2013-05-05 MED ORDER — MEASLES, MUMPS & RUBELLA VAC ~~LOC~~ INJ
0.5000 mL | INJECTION | Freq: Once | SUBCUTANEOUS | Status: DC
  Filled 2013-05-05: qty 0.5

## 2013-05-05 MED ORDER — SCOPOLAMINE 1 MG/3DAYS TD PT72
MEDICATED_PATCH | TRANSDERMAL | Status: AC
  Administered 2013-05-05: 1.5 mg via TRANSDERMAL
  Filled 2013-05-05: qty 1

## 2013-05-05 MED ORDER — METOCLOPRAMIDE HCL 5 MG/ML IJ SOLN: 10.0000 mg | Freq: Three times a day (TID) | INTRAMUSCULAR | Status: DC | PRN

## 2013-05-05 MED ORDER — FAMOTIDINE-CA CARB-MAG HYDROX 10-800-165 MG PO CHEW: 2.0000 | CHEWABLE_TABLET | Freq: Two times a day (BID) | ORAL | Status: DC | PRN

## 2013-05-05 MED ORDER — LABETALOL HCL 5 MG/ML IV SOLN
40.0000 mg | Freq: Once | INTRAVENOUS | Status: AC
  Administered 2013-05-05: 40 mg via INTRAVENOUS

## 2013-05-05 MED ORDER — LACTATED RINGERS IV SOLN
INTRAVENOUS | Status: DC | PRN
  Administered 2013-05-05: 17:00:00 via INTRAVENOUS

## 2013-05-05 MED ORDER — LANOLIN HYDROUS EX OINT: 1.0000 "application " | TOPICAL_OINTMENT | CUTANEOUS | Status: DC | PRN

## 2013-05-05 MED ORDER — DIPHENHYDRAMINE HCL 25 MG PO CAPS: 25.0000 mg | ORAL_CAPSULE | Freq: Four times a day (QID) | ORAL | Status: DC | PRN

## 2013-05-05 MED ORDER — IBUPROFEN 600 MG PO TABS
600.0000 mg | ORAL_TABLET | Freq: Four times a day (QID) | ORAL | Status: DC
  Administered 2013-05-05 – 2013-05-10 (×18): 600 mg via ORAL
  Filled 2013-05-05 (×18): qty 1

## 2013-05-05 MED ORDER — IBUPROFEN 600 MG PO TABS: 600.0000 mg | ORAL_TABLET | Freq: Four times a day (QID) | ORAL | Status: DC

## 2013-05-05 MED ORDER — FENTANYL CITRATE 0.05 MG/ML IJ SOLN: 25.0000 ug | INTRAMUSCULAR | Status: DC | PRN

## 2013-05-05 MED ORDER — PRENATAL MULTIVITAMIN CH
1.0000 | ORAL_TABLET | Freq: Every day | ORAL | Status: DC
  Administered 2013-05-06 – 2013-05-09 (×4): 1 via ORAL
  Filled 2013-05-05 (×3): qty 1

## 2013-05-05 MED ORDER — NALOXONE HCL 0.4 MG/ML IJ SOLN: 0.4000 mg | INTRAMUSCULAR | Status: DC | PRN

## 2013-05-05 MED ORDER — DIPHENHYDRAMINE HCL 25 MG PO CAPS
25.0000 mg | ORAL_CAPSULE | Freq: Four times a day (QID) | ORAL | Status: DC | PRN
  Administered 2013-05-06: 25 mg via ORAL
  Filled 2013-05-05 (×2): qty 1

## 2013-05-05 MED ORDER — FUROSEMIDE 10 MG/ML IJ SOLN
10.0000 mg | Freq: Once | INTRAMUSCULAR | Status: AC
  Administered 2013-05-05: 10 mg via INTRAVENOUS
  Filled 2013-05-05: qty 1

## 2013-05-05 MED ORDER — LANOLIN HYDROUS EX OINT: TOPICAL_OINTMENT | CUTANEOUS | Status: DC | PRN

## 2013-05-05 MED ORDER — OXYCODONE-ACETAMINOPHEN 5-325 MG PO TABS
1.0000 | ORAL_TABLET | ORAL | Status: DC | PRN
  Administered 2013-05-06 – 2013-05-08 (×3): 1 via ORAL
  Filled 2013-05-05 (×3): qty 1

## 2013-05-05 MED ORDER — OXYCODONE-ACETAMINOPHEN 5-325 MG PO TABS: 1.0000 | ORAL_TABLET | ORAL | Status: DC | PRN

## 2013-05-05 MED ORDER — MORPHINE SULFATE 0.5 MG/ML IJ SOLN
INTRAMUSCULAR | Status: AC
  Filled 2013-05-05: qty 10

## 2013-05-05 MED ORDER — DEXTROSE 5 % IV SOLN
3.0000 g | Freq: Once | INTRAVENOUS | Status: AC
  Administered 2013-05-05: 3 g via INTRAVENOUS
  Filled 2013-05-05: qty 3000

## 2013-05-05 MED ORDER — DIPHENHYDRAMINE HCL 25 MG PO CAPS
25.0000 mg | ORAL_CAPSULE | ORAL | Status: DC | PRN
  Administered 2013-05-06: 25 mg via ORAL

## 2013-05-05 MED ORDER — MENTHOL 3 MG MT LOZG: 1.0000 | LOZENGE | OROMUCOSAL | Status: DC | PRN

## 2013-05-05 MED ORDER — CITRIC ACID-SODIUM CITRATE 334-500 MG/5ML PO SOLN
30.0000 mL | Freq: Once | ORAL | Status: AC
  Administered 2013-05-05: 30 mL via ORAL

## 2013-05-05 MED ORDER — ONDANSETRON HCL 4 MG/2ML IJ SOLN: 4.0000 mg | Freq: Three times a day (TID) | INTRAMUSCULAR | Status: DC | PRN

## 2013-05-05 MED ORDER — LABETALOL HCL 5 MG/ML IV SOLN
10.0000 mg | INTRAVENOUS | Status: AC | PRN
  Administered 2013-05-05: 10 mg via INTRAVENOUS

## 2013-05-05 MED ORDER — CHLOROPROCAINE HCL 3 % IJ SOLN
INTRAMUSCULAR | Status: AC
  Filled 2013-05-05: qty 20

## 2013-05-05 MED ORDER — MORPHINE SULFATE (PF) 0.5 MG/ML IJ SOLN
INTRAMUSCULAR | Status: DC | PRN
  Administered 2013-05-05: 1 mg via INTRAVENOUS

## 2013-05-05 MED ORDER — LACTATED RINGERS IV SOLN
INTRAVENOUS | Status: DC
  Administered 2013-05-05: 17:00:00 via INTRAVENOUS

## 2013-05-05 MED ORDER — CEFAZOLIN SODIUM-DEXTROSE 2-3 GM-% IV SOLR
2.0000 g | Freq: Three times a day (TID) | INTRAVENOUS | Status: AC
  Administered 2013-05-05 – 2013-05-06 (×2): 2 g via INTRAVENOUS
  Filled 2013-05-05 (×2): qty 50

## 2013-05-05 SURGICAL SUPPLY — 32 items
CLAMP CORD UMBIL (MISCELLANEOUS) IMPLANT
CLOTH BEACON ORANGE TIMEOUT ST (SAFETY) ×2 IMPLANT
CONTAINER PREFILL 10% NBF 15ML (MISCELLANEOUS) IMPLANT
DRAPE LG THREE QUARTER DISP (DRAPES) ×2 IMPLANT
DRSG OPSITE 11X17.75 LRG (GAUZE/BANDAGES/DRESSINGS) ×2 IMPLANT
DRSG OPSITE POSTOP 4X10 (GAUZE/BANDAGES/DRESSINGS) ×2 IMPLANT
DRSG VASELINE 3X18 (GAUZE/BANDAGES/DRESSINGS) ×2 IMPLANT
DURAPREP 26ML APPLICATOR (WOUND CARE) ×2 IMPLANT
ELECT REM PT RETURN 9FT ADLT (ELECTROSURGICAL) ×2
ELECTRODE REM PT RTRN 9FT ADLT (ELECTROSURGICAL) ×1 IMPLANT
EXTRACTOR VACUUM KIWI (MISCELLANEOUS) IMPLANT
EXTRACTOR VACUUM M CUP 4 TUBE (SUCTIONS) IMPLANT
GLOVE BIO SURGEON STRL SZ7.5 (GLOVE) ×2 IMPLANT
GOWN PREVENTION PLUS XLARGE (GOWN DISPOSABLE) ×2 IMPLANT
GOWN STRL REIN XL XLG (GOWN DISPOSABLE) ×4 IMPLANT
KIT ABG SYR 3ML LUER SLIP (SYRINGE) ×2 IMPLANT
NEEDLE HYPO 25X5/8 SAFETYGLIDE (NEEDLE) ×2 IMPLANT
NS IRRIG 1000ML POUR BTL (IV SOLUTION) ×2 IMPLANT
PACK C SECTION WH (CUSTOM PROCEDURE TRAY) ×2 IMPLANT
PAD OB MATERNITY 4.3X12.25 (PERSONAL CARE ITEMS) ×2 IMPLANT
RTRCTR C-SECT PINK 25CM LRG (MISCELLANEOUS) IMPLANT
STAPLER VISISTAT 35W (STAPLE) ×2 IMPLANT
SUT PLAIN 0 NONE (SUTURE) IMPLANT
SUT VIC AB 0 CT1 36 (SUTURE) ×12 IMPLANT
SUT VIC AB 3-0 CTX 36 (SUTURE) ×2 IMPLANT
SUT VIC AB 3-0 SH 27 (SUTURE)
SUT VIC AB 3-0 SH 27X BRD (SUTURE) IMPLANT
SUT VIC AB 4-0 KS 27 (SUTURE) IMPLANT
SUT VICRYL 0 TIES 12 18 (SUTURE) IMPLANT
TOWEL OR 17X24 6PK STRL BLUE (TOWEL DISPOSABLE) ×6 IMPLANT
TRAY FOLEY CATH 14FR (SET/KITS/TRAYS/PACK) IMPLANT
WATER STERILE IRR 1000ML POUR (IV SOLUTION) ×2 IMPLANT

## 2013-05-05 NOTE — OR Nursing (Addendum)
Uterus massaged by S. Aireana Ryland RN. Two tubes of cord blood sent to lab.  5cc of blood evacuated from uterus during uterine massage. 

## 2013-05-05 NOTE — Op Note (Signed)
[name], Rachael NO.:  [id number]  MEDICAL RECORD NO.:  [id number]  LOCATION:  [facility]                          [facility]AC[facility]:  [facility]  PHYSICIAN:  Malachi Pro. Ambrose Mantle, M.D. DATE OF BIRTH:  09-Nov-1981  DATE OF PROCEDURE:  05/05/2013 DATE OF DISCHARGE:                              OPERATIVE REPORT   PREOPERATIVE DIAGNOSES:  Intrauterine pregnancy at 27 weeks 5 days, preeclampsia, congestive heart failure.  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  OPERATION:  Low-transverse cervical cesarean section.  OPERATOR:  Malachi Pro. Ambrose Mantle, MD  ASSISTANTMarice Potter.  ANESTHESIA:  Epidural, Dr. Malen Gauze.  PROCEDURE IN DETAIL:  The patient was brought to the operating room and given an epidural anesthetic by Dr. Malen Gauze.  Fetal heart tones were auscultated with the fetal heart monitor and were in the 150s.  After the anesthesia was effective and prep was ready, the monitor was removed and the DuraPrep was done.  A Foley catheter was already indwelling. The abdomen was prepped with a DuraPrep and allowed 3 minutes to try.  A time-out was done.  The tape that was elevating the abdominal wall was covered with a sterile drape and a sterile drape was placed over the pubis.  The Steri-Drape was applied.  I had marked the incision with a marking pen.  After anesthesia was confirmed, I made a transverse incision through the skin and subcutaneous tissue.  There was a large amount of edema in the subcutaneous tissue.  The fascia was identified, incised transversely, elevated from the rectus muscles superiorly and inferiorly, then the rectus muscle was split in the midline and the peritoneum was opened bluntly.  The peritoneum was retracted with a bladder blade and the omentum and small bowel appeared in the incision. I was able to pack this away.  I made a decision to  make a transverse incision because the lower uterine segment was significantly widened.  I made a transverse incision through the superficial layers of the myometrium into the amniotic sac with my finger and it should be noted that on entering the abdominal cavity, there was a large amount of ascitic fluid.  The incision was enlarged by pulling superiorly and inferiorly.  I was able to deliver the vertex easily through the incisional opening,  The remainder of the body followed without difficulty.  The cord was clamped.  The infant was given to Dr. Andree Moro who was in attendance.  She assigned the female infant Apgars of 7 and 7 at 1 and 5 minutes.  I visited the baby in the NICU after the surgery and Dr. Mikle Bosworth feels like she needs to watch the abdominal exam because the abdomen of the baby is somewhat discolored and x-ray shows gaseous distention of the bowel.  The umbilical cord had a large number of cysts in the umbilical cord.  Routine cord blood studies were obtained.  The placenta was removed intact.  The inside of the uterus was palpated, found to be free of any remaining products of conception. There was about a 5 cm fibroid on the anterior uterine surface.  There was probably intramural and submucosal.  I dilated the cervix with a ring forceps and closed the uterine incision with 2 running sutures of 0 Vicryl locking the first layer, nonlocking on the second layer.  Liberal irrigation confirmed hemostasis.  The gutters were blotted free of blood.  I used a pack to keep the bowel out of the way.  I removed it and because the patient seemed to feel each movement, I placed Nesacaine in the abdomen and in the subcutaneous and subfascial tissue, inspected for hemostasis and then closed the abdominal wall with interrupted sutures of 0 Vicryl on the peritoneum and rectus muscle in 1 layer, 2 running sutures of 0 Vicryl on the fascia, a running 3-0 Vicryl on the subcutaneous tissue and  staples on the skin.  Dressing was applied over Vaseline gauze and the patient was returned to recovery in satisfactory condition.  I am going to treat her postoperatively with magnesium sulfate.  Blood loss was estimated at 1000 mL.  There was a huge amount of fluid.     Malachi Pro. Ambrose Mantle, M.D.     TFH/MEDQ  D:  05/05/2013  T:  05/05/2013  Job:  [id number]

## 2013-05-05 NOTE — Transfer of Care (Signed)
Immediate Anesthesia Transfer of Care Note  Patient: Rachael Santiago  Procedure(s) Performed: Procedure(s): Primary cesarean section with delivery of baby boy at 25.   (N/A)  Patient Location: PACU  Anesthesia Type:Epidural  Level of Consciousness: awake and alert   Airway & Oxygen Therapy: Patient Spontanous Breathing and Patient connected to nasal cannula oxygen  Post-op Assessment: Report given to PACU RN and Post -op Vital signs reviewed and stable  Post vital signs: Reviewed and stable  Complications: No apparent anesthesia complications

## 2013-05-05 NOTE — Consult Note (Signed)
CARDIOLOGY CONSULT NOTE  Patient ID: Rachael Santiago MRN: [id number] DOB/AGE: [birth year] 32 y.o.  Admit date: 05/01/2013 Referring Physician Primary PhysicianNo primary provider on file. Primary Cardiologist   Narda Rutherford   HPI: The patient is appears to have preeclampsia. She is having increasing shortness of breath. She does have edema. Her chest x-ray has suggested some volume overload. Cardiology is consulted for further input concerning her cardiac status. This is her first pregnancy. There is no prior history of any cardiac abnormality. She is uncomfortable with her shortness of breath. Currently her O2 sat is adequate.    Past Medical History  Diagnosis Date  . Medical history non-contributory   . Ovarian cyst   . Antepartum mild preeclampsia 05/03/2013  . Hypertension in pregnancy, preeclampsia, severe, antepartum 05/03/2013    History reviewed. No pertinent family history.  History   Social History  . Marital Status: Married    Spouse Name: N/A    Number of Children: N/A  . Years of Education: N/A   Occupational History  . Not on file.   Social History Main Topics  . Smoking status: Never Smoker   . Smokeless tobacco: Never Used  . Alcohol Use: No  . Drug Use: No  . Sexually Active: Yes    Birth Control/ Protection: None   Other Topics Concern  . Not on file   Social History Narrative  . No narrative on file    Past Surgical History  Procedure Laterality Date  . Ovarian cyst surgery       Prescriptions prior to admission  Medication Sig Dispense Refill  . famotidine-calcium carbonate-magnesium hydroxide (PEPCID COMPLETE) [phone] MG CHEW Chew 2 tablets by mouth 2 (two) times daily as needed.  60 tablet  4  . ondansetron (ZOFRAN) 4 MG tablet Take 1 tablet (4 mg total) by mouth every 4 (four) hours as needed for nausea.  30 tablet  0  . Prenatal Vit-Fe Fumarate-FA (MULTIVITAMIN-PRENATAL) 27-0.8 MG TABS Take 1 tablet by mouth daily.       Review of  systems   Patient denies fever, chills, headache, sweats, rash, change in vision, change in hearing, chest pain, nausea vomiting, urinary symptoms. All other systems are reviewed and are negative.  Physical Exam: Blood pressure 155/102, pulse 100, temperature 97.8 F (36.6 C), temperature source Oral, resp. rate 22, height 5\' 3"  (1.6 m), weight 243 lb 3.2 oz (110.315 kg), last menstrual period 10/19/2012, SpO2 94.00%, unknown if currently breastfeeding.     The patient is oriented to person time and place. Affect is normal. She is concerned with her overall sensation of shortness of breath. She is in no distress. There is no jugulovenous distention. Lungs reveal a few scattered rhonchi. There is decreased breath sounds at the bases. Cardiac exam reveals S1 and S2. The abdomen is soft. She does have edema although I do not feel pitting edema. There no musculoskeletal deformities. There are no skin rashes. Labs:   Lab Results  Component Value Date   WBC 14.3* 05/05/2013   HGB 9.6* 05/05/2013   HCT 28.9* 05/05/2013   MCV 85.3 05/05/2013   PLT 187 05/05/2013    Recent Labs Lab 05/05/13 0650  NA 137  K 4.2  CL 106  CO2 23  BUN 16  CREATININE 0.74  CALCIUM 8.0*  PROT 4.1*  BILITOT 0.1*  ALKPHOS 86  ALT 25  AST 34  GLUCOSE 87   No results found for this basename: CKTOTAL, CKMB, CKMBINDEX, TROPONINI  No results found for this basename: CHOL   No results found for this basename: HDL   No results found for this basename: LDLCALC   No results found for this basename: TRIG   No results found for this basename: CHOLHDL   No results found for this basename: LDLDIRECT    BNP (last 3 results) No results found for this basename: PROBNP,  in the last 8760 hours    Radiology: Dg Chest 2 View  05/05/2013   *RADIOLOGY REPORT*  Clinical Data: Cough and preeclampsia.  The patient is [redacted] weeks pregnant.  CHEST - 2 VIEW  Comparison: None.  Findings: The heart is mildly enlarged.  There is  increase in pulmonary vascularity bilaterally, especially in the lower lung zones, consistent with some degree of interstitial edema during pregnancy.  There may be trace bilateral pleural effusions.  No pulmonary infiltrate is identified.  Visualized bony structures are unremarkable.  IMPRESSION: Findings suggestive of interstitial edema during pregnancy.  Heart is mildly enlarged and there is increase in bilateral pulmonary vascularity.   Original Report Authenticated By: Irish Lack, M.D.   EKG:   There is no EKG available for my review at this time.  Two-dimensional echo    The patient has excellent left ventricular function. The ejection fraction is 60-65%. Her right heart size and function are normal. The inferior vena cava is dilated consistent with some increase in right atrial pressure. There is partial collapse. The leaflets of the mitral valve appear normal. There is mitral regurgitation that appears to be moderate. Also there is an insignificant pericardial effusion.  ASSESSMent:    Hypertension in pregnancy, preeclampsia, severe, antepartum     Dr. Ambrose Mantle and his team or managing this expertly. We will have to see if her blood pressure response to diuresis. Dr. Ambrose Mantle is most familiar with the medications used for hypertension in this setting. From the cardiac viewpoint any of the standard antihypertensives could be used.     Acute CHF     The patient does appear to have congestive heart failure. I have given an order for 40 of IV Lasix. I think diuresis will help her. Fortunately she does not appear to have a peripartum cardiomyopathy. I am concerned about her mitral regurgitation. There is no evidence of any acute structural abnormality of the mitral valve. I am hopeful that with diuresis she will have a decrease in the mitral regurgitation. This will have to be followed carefully.    Mitral regurgitation      Mitral regurgitation appears to be moderate. There is no structural  abnormality the mitral valve that is obvious. It is possible that she has stretching of the ventricle related to volume overload. The first step will be diuresis and followup.    Pericardial effusion     There is a trivial pericardial effusion noted. This is not a significant finding. This could be related to fluid overload.  From the cardiac viewpoint the plan will be diuresis. We will follow the patient's course with you. TSH has been ordered if it is not recently available. I do not see an EKG. This will also be ordered.  Jerral Bonito, MD

## 2013-05-05 NOTE — Anesthesia Procedure Notes (Signed)
Epidural Patient location during procedure: OB Start time: 05/05/2013 5:05 PM  Staffing Anesthesiologist: Maycel Riffe A. Performed by: anesthesiologist   Preanesthetic Checklist Completed: patient identified, site marked, surgical consent, pre-op evaluation, timeout performed, IV checked, risks and benefits discussed and monitors and equipment checked  Epidural Patient position: sitting Prep: site prepped and draped and DuraPrep Patient monitoring: continuous pulse ox and blood pressure Approach: midline Injection technique: LOR air  Needle:  Needle type: Tuohy  Needle gauge: 17 G Needle length: 9 cm and 9 Needle insertion depth: 7 cm Catheter type: closed end flexible Catheter size: 19 Gauge Catheter at skin depth: 11 cm Test dose: negative and Other  Assessment Events: blood not aspirated, injection not painful, no injection resistance, negative IV test and no paresthesia  Additional Notes Patient identified. Risks and benefits discussed including failed block, incomplete  Pain control, post dural puncture headache, nerve damage, paralysis, blood pressure Changes, nausea, vomiting, reactions to medications-both toxic and allergic and post Partum back pain. All questions were answered. Patient expressed understanding and wished to proceed. Sterile technique was used throughout procedure. Epidural site was Dressed with sterile barrier dressing. No paresthesias, signs of intravascular injection Or signs of intrathecal spread were encountered.   Attempt x 2 Patient was more comfortable after the epidural was dosed. Please see RN's note for documentation of vital signs and FHR which are stable.

## 2013-05-05 NOTE — Progress Notes (Signed)
Patient ID: Rachael Santiago, female   DOB: Apr 07, 1981, 32 y.o.   MRN: [id number] Pt's VS are stable. She is gaining weight around 5 pounds since admission. FHR looks normal Labs are normal She reports coughing during the night Will get CXR

## 2013-05-05 NOTE — Progress Notes (Signed)
The pt was seen by Dr. Myrtis Ser from cardiology. He feels she does have CHF I discussed the alternate ways of delivery including cytotec followed by pitocin that could take an extended period of time versus C section He feels the pt should not be subjected to a prolonged delivery process because he feels her heart function is borderline and the heart could be jeopardized by a long delivery process. Therefore, I am going to proceed with c section. The cervix is long and closed and there is no PP palpable even though it is vertex by [place] I have informed the pt of the recommendations and she is ready to proceed. I have also told her I may need to do a vertical incision in the uterus.I have also told her the NICU is full and there is a possibility the baby will need to be transferred.

## 2013-05-05 NOTE — Anesthesia Preprocedure Evaluation (Signed)
Anesthesia Evaluation  Patient identified by MRN, date of birth, ID band Patient awake    Reviewed: Allergy & Precautions, H&P , NPO status , Patient's Chart, lab work & pertinent test results  Airway Mallampati: III TM Distance: >3 FB Neck ROM: Full    Dental no notable dental hx. (+) Teeth Intact   Pulmonary shortness of breath, with exertion, at rest and lying,  Current cough     rales    Cardiovascular hypertension, Pt. on medications +CHF Rhythm:Regular Rate:Normal  Severe Pre ecclampsia Pericardial effusion Interstitial pulmonary edema   Neuro/Psych negative neurological ROS  negative psych ROS   GI/Hepatic negative GI ROS, Neg liver ROS,   Endo/Other  Morbid obesity  Renal/GU negative Renal ROS  negative genitourinary   Musculoskeletal negative musculoskeletal ROS (+)   Abdominal (+) + obese,   Peds  Hematology negative hematology ROS (+)   Anesthesia Other Findings   Reproductive/Obstetrics IUP 27 5/7weeks Severe Preecclampsia                           Anesthesia Physical Anesthesia Plan  ASA: III and emergent  Anesthesia Plan: Epidural   Post-op Pain Management:    Induction:   Airway Management Planned: Natural Airway and Nasal Cannula  Additional Equipment:   Intra-op Plan:   Post-operative Plan:   Informed Consent: I have reviewed the patients History and Physical, chart, labs and discussed the procedure including the risks, benefits and alternatives for the proposed anesthesia with the patient or authorized representative who has indicated his/her understanding and acceptance.   Dental advisory given  Plan Discussed with: CRNA, Anesthesiologist and Surgeon  Anesthesia Plan Comments:         Anesthesia Quick Evaluation

## 2013-05-05 NOTE — Anesthesia Postprocedure Evaluation (Signed)
  Anesthesia Post Note  Patient: Rachael Santiago  Procedure(s) Performed: Procedure(s) (LRB): Primary cesarean section with delivery of baby boy at 9.   (N/A)  Anesthesia type: Epidural  Patient location: PACU  Post pain: Pain level controlled  Post assessment: Post-op Vital signs reviewed  Last Vitals:  Filed Vitals:   05/05/13 2000  BP: 158/103  Pulse: 102  Temp:   Resp: 24    Post vital signs: Reviewed  Level of consciousness: awake  Complications: No apparent anesthesia complications

## 2013-05-05 NOTE — Progress Notes (Signed)
Patient ID: Rachael Santiago, female   DOB: 1981/02/26, 32 y.o.   MRN: [id number] CXR shows interstitial edema with increased vascular markings. The heart is mildly enlarged. I will ask cardiology to see. I have spoken to Dr. Otho Perl from MFM and he OK's the use of small doses of lasix

## 2013-05-05 NOTE — Progress Notes (Signed)
Per cardiology PA, echo tech is on the way for study, cardiologist has been notified and will be coming by soon.

## 2013-05-05 NOTE — Progress Notes (Signed)
  Echocardiogram 2D Echocardiogram has been performed.  Raymonde Hamblin, [facility] 05/05/2013, 1:07 PM

## 2013-05-06 ENCOUNTER — Encounter (HOSPITAL_COMMUNITY): Payer: Self-pay | Admitting: *Deleted

## 2013-05-06 LAB — COMPREHENSIVE METABOLIC PANEL
AST: 32 U/L (ref 0–37)
Alkaline Phosphatase: 89 U/L (ref 39–117)
CO2: 25 mEq/L (ref 19–32)
Chloride: 102 mEq/L (ref 96–112)
Creatinine, Ser: 0.87 mg/dL (ref 0.50–1.10)
GFR calc non Af Amer: 89 mL/min — ABNORMAL LOW (ref >90)
Potassium: 4.8 mEq/L (ref 3.5–5.1)
Total Bilirubin: 0.2 mg/dL — ABNORMAL LOW (ref 0.3–1.2)

## 2013-05-06 LAB — CBC
MCHC: 32.6 g/dL (ref 30.0–36.0)
MCV: 85.9 fL (ref 78.0–100.0)
Platelets: 174 10*3/uL (ref 150–400)
RDW: 15.2 % (ref 11.5–15.5)
WBC: 13 10*3/uL — ABNORMAL HIGH (ref 4.0–10.5)

## 2013-05-06 MED ORDER — FUROSEMIDE 10 MG/ML IJ SOLN
20.0000 mg | Freq: Once | INTRAMUSCULAR | Status: AC
  Administered 2013-05-06: 20 mg via INTRAVENOUS
  Filled 2013-05-06: qty 2

## 2013-05-06 MED ORDER — LABETALOL HCL 5 MG/ML IV SOLN
20.0000 mg | Freq: Once | INTRAVENOUS | Status: AC
  Administered 2013-05-06: 20 mg via INTRAVENOUS

## 2013-05-06 MED ORDER — LABETALOL HCL 100 MG PO TABS
100.0000 mg | ORAL_TABLET | Freq: Two times a day (BID) | ORAL | Status: DC
  Administered 2013-05-06 – 2013-05-07 (×3): 100 mg via ORAL
  Filled 2013-05-06 (×5): qty 1

## 2013-05-06 NOTE — Lactation Note (Signed)
This note was copied from the chart of Rachael Santiago. Lactation Consultation Note  Patient Name: Rachael Santiago [id number] Date: 05/06/2013 Reason for consult: Initial assessment   Maternal Data Formula Feeding for Exclusion: Yes Reason for exclusion: Admission to Intensive Care Unit (ICU) post-partum Infant to breast within first hour of birth: No Breastfeeding delayed due to:: Infant status Does the patient have breastfeeding experience prior to this delivery?: No  Feeding    LATCH Score/Interventions                      Lactation Tools Discussed/Used     Consult Status Consult Status: Follow-up Date: 05/07/13 Follow-up type: In-patient  Mom reports that she has pumped one time through the night. Encouraged to pump q 3 hours 8X /24 hours. Mom sleepy at present. Will need more teaching when she is more awake. No questions at present. NICU booklet given to mom  [name] 05/06/2013, 10:10 AM

## 2013-05-06 NOTE — Progress Notes (Signed)
Patient ID: Rachael Santiago, female   DOB: 1981-03-08, 32 y.o.   MRN: [id number] #1 AFEBRILE bp 138/97 oUTPUT GOOD BUT WEIGHT 239 Will give a small dose of lasix and start labetalol HGB stable and labs normal. Baby is on the ventilator on 23 % O2 and sat 97%

## 2013-05-06 NOTE — Anesthesia Postprocedure Evaluation (Signed)
  Anesthesia Post-op Note  Patient: Rachael Santiago  Procedure(s) Performed: Procedure(s): Primary cesarean section with delivery of baby boy at 29.   (N/A)  Patient Location: Mother/Baby  Anesthesia Type:Epidural  Level of Consciousness: awake  Airway and Oxygen Therapy: Patient Spontanous Breathing  Post-op Pain: mild  Post-op Assessment: Patient's Cardiovascular Status Stable and Respiratory Function Stable  Post-op Vital Signs: stable  Complications: No apparent anesthesia complications

## 2013-05-06 NOTE — Progress Notes (Signed)
CSW aware of NICU admission. MOB in AICU and unable to see CSW today. CSW will consult with MOB when more stable.   [phone]

## 2013-05-06 NOTE — Progress Notes (Signed)
Patient ID: Rachael Santiago, female   DOB: 09-20-1981, 32 y.o.   MRN: [id number]   SUBJECTIVE:  Yesterday after completing my consultation I spoke with Dr. Ambrose Mantle by telephone. We had a very careful and complete discussion of all of the issues. The dad is very clear that if the patient has congestive heart failure, that the baby should be delivered. I did make it clear that I felt that the patient did have congestive heart failure.  The baby was delivered by C-section last evening. The baby is tiny and on a respirator. Patient is feeling better.   Filed Vitals:   05/06/13 0700 05/06/13 0803 05/06/13 0900 05/06/13 1000  BP: 143/103 138/97 141/105 137/92  Pulse: 64 86 66 66  Temp:  98.1 F (36.7 C)    TempSrc:  Oral    Resp:  20 18 20   Height:      Weight:  239 lb 9.6 oz (108.682 kg)    SpO2: 98% 99% 98% 98%    Intake/Output Summary (Last 24 hours) at 05/06/13 1031 Last data filed at 05/06/13 1000  Gross per 24 hour  Intake 2458.33 ml  Output   3000 ml  Net -541.67 ml    LABS: Basic Metabolic Panel:  Recent Labs  41/32/44 0650 05/06/13 0500  NA 137 134*  K 4.2 4.8  CL 106 102  CO2 23 25  GLUCOSE 87 90  BUN 16 16  CREATININE 0.74 0.87  CALCIUM 8.0* 7.9*   Liver Function Tests:  Recent Labs  05/05/13 0650 05/06/13 0500  AST 34 32  ALT 25 19  ALKPHOS 86 89  BILITOT 0.1* 0.2*  PROT 4.1* 4.4*  ALBUMIN 1.6* 1.6*   No results found for this basename: LIPASE, AMYLASE,  in the last 72 hours CBC:  Recent Labs  05/04/13 0530 05/05/13 0650 05/05/13 1935 05/06/13 0500  WBC 15.7* 14.3* 14.8* 13.0*  NEUTROABS 11.5* 10.1*  --   --   HGB 9.0* 9.6* 9.3* 9.7*  HCT 27.8* 28.9* 28.1* 29.8*  MCV 86.1 85.3 85.2 85.9  PLT 198 187 185 174   Cardiac Enzymes: No results found for this basename: CKTOTAL, CKMB, CKMBINDEX, TROPONINI,  in the last 72 hours BNP: No components found with this basename: POCBNP,  D-Dimer: No results found for this basename: DDIMER,  in the last  72 hours Hemoglobin A1C: No results found for this basename: HGBA1C,  in the last 72 hours Fasting Lipid Panel: No results found for this basename: CHOL, HDL, LDLCALC, TRIG, CHOLHDL, LDLDIRECT,  in the last 72 hours Thyroid Function Tests:  Recent Labs  05/05/13 0650  TSH 5.134*    RADIOLOGY: Dg Chest 2 View  05/05/2013   *RADIOLOGY REPORT*  Clinical Data: Cough and preeclampsia.  The patient is [redacted] weeks pregnant.  CHEST - 2 VIEW  Comparison: None.  Findings: The heart is mildly enlarged.  There is increase in pulmonary vascularity bilaterally, especially in the lower lung zones, consistent with some degree of interstitial edema during pregnancy.  There may be trace bilateral pleural effusions.  No pulmonary infiltrate is identified.  Visualized bony structures are unremarkable.  IMPRESSION: Findings suggestive of interstitial edema during pregnancy.  Heart is mildly enlarged and there is increase in bilateral pulmonary vascularity.   Original Report Authenticated By: Irish Lack, M.D.   US Ob Comp + 14 Wk  05/02/2013   OBSTETRICAL ULTRASOUND: This exam was performed within a [facility]. The OB US report was generated in the [facility]  system, and faxed to the ordering physician.   This report is also available in [employer] and in the [employer]. See [facility] Obstetric US report.    PHYSICAL EXAM   patient is oriented to person time and place. Affect is normal. There are scattered rales. Cardiac exam reveals S1 and S2. The abdomen is soft. There is peripheral edema.  The patient is not currently on telemetry. Her rate is under good control. I reviewed the EKG that was done yesterday. There was sinus rhythm and no significant EKG abnormality.   ASSESSMENT AND PLAN:    Hypertension in pregnancy, preeclampsia, severe, antepartum     The baby has been delivered. Hopefully the patient will now continue to improve.    Acute CHF     The patient is to  receive Lasix this morning. We will watch her urine output.    Mitral regurgitation     There was normal left ventricular function on the echo. There was moderate mitral regurgitation. At some point the patient will need a followup echo to reassess her mitral regurgitation. I did not see a definite structural  valvular abnormality.    Pericardial effusion   There was an insignificant pericardial effusion. This can be reassessed also a later date when she has a followup echo.   Willa Rough 05/06/2013 10:31 AM

## 2013-05-07 ENCOUNTER — Encounter (HOSPITAL_COMMUNITY): Payer: Self-pay | Admitting: Obstetrics and Gynecology

## 2013-05-07 DIAGNOSIS — I059 Rheumatic mitral valve disease, unspecified: Secondary | ICD-10-CM

## 2013-05-07 DIAGNOSIS — O1410 Severe pre-eclampsia, unspecified trimester: Secondary | ICD-10-CM

## 2013-05-07 LAB — COMPREHENSIVE METABOLIC PANEL
ALT: 14 U/L (ref 0–35)
AST: 24 U/L (ref 0–37)
Albumin: 1.5 g/dL — ABNORMAL LOW (ref 3.5–5.2)
Alkaline Phosphatase: 81 U/L (ref 39–117)
BUN: 11 mg/dL (ref 6–23)
Chloride: 101 mEq/L (ref 96–112)
Potassium: 4.3 mEq/L (ref 3.5–5.1)
Sodium: 135 mEq/L (ref 135–145)
Total Bilirubin: 0.1 mg/dL — ABNORMAL LOW (ref 0.3–1.2)
Total Protein: 4.5 g/dL — ABNORMAL LOW (ref 6.0–8.3)

## 2013-05-07 LAB — CBC WITH DIFFERENTIAL/PLATELET
Basophils Absolute: 0 10*3/uL (ref 0.0–0.1)
Basophils Relative: 0 % (ref 0–1)
Hemoglobin: 8.9 g/dL — ABNORMAL LOW (ref 12.0–15.0)
MCHC: 33.2 g/dL (ref 30.0–36.0)
Monocytes Relative: 8 % (ref 3–12)
Neutro Abs: 9.4 10*3/uL — ABNORMAL HIGH (ref 1.7–7.7)
Neutrophils Relative %: 73 % (ref 43–77)
Platelets: 185 10*3/uL (ref 150–400)

## 2013-05-07 MED ORDER — LABETALOL HCL 200 MG PO TABS
200.0000 mg | ORAL_TABLET | Freq: Two times a day (BID) | ORAL | Status: DC
  Administered 2013-05-07: 200 mg via ORAL
  Filled 2013-05-07 (×2): qty 1

## 2013-05-07 MED ORDER — FUROSEMIDE 10 MG/ML IJ SOLN
40.0000 mg | Freq: Once | INTRAMUSCULAR | Status: DC
  Filled 2013-05-07: qty 4

## 2013-05-07 NOTE — Progress Notes (Addendum)
Subjective: Breathing is still a little short.  No CP Objective: Filed Vitals:   05/07/13 0903 05/07/13 1005 05/07/13 1100 05/07/13 1300  BP: 149/98 145/89 153/97 137/90  Pulse: 94   88  Temp:    98.2 F (36.8 C)  TempSrc:    Oral  Resp: 18   18  Height:      Weight:      SpO2: 96%   96%   Weight change: -3 lb 9.6 oz (-1.633 kg)  Intake/Output Summary (Last 24 hours) at 05/07/13 1525 Last data filed at 05/07/13 1100  Gross per 24 hour  Intake 2037.5 ml  Output   4925 ml  Net -2887.5 ml    General: Alert, awake, oriented x3, in no acute distress Neck:   Heart: Regular rate and rhythm, without murmurs, rubs, gallops.  Lungs: Clear to auscultation.  No rales or wheezes. Exemities:  2+ edema.   Neuro: Grossly intact, nonfocal.   Lab Results: Results for orders placed during the hospital encounter of 05/01/13 (from the past 24 hour(s))  CBC WITH DIFFERENTIAL     Status: Abnormal   Collection Time    05/07/13  5:41 AM      Result Value Range   WBC 12.9 (*) 4.0 - 10.5 K/uL   RBC 3.17 (*) 3.87 - 5.11 MIL/uL   Hemoglobin 8.9 (*) 12.0 - 15.0 g/dL   HCT 16.1 (*) 09.6 - 04.5 %   MCV 84.5  78.0 - 100.0 fL   MCH 28.1  26.0 - 34.0 pg   MCHC 33.2  30.0 - 36.0 g/dL   RDW 40.9  81.1 - 91.4 %   Platelets 185  150 - 400 K/uL   Neutrophils Relative % 73  43 - 77 %   Neutro Abs 9.4 (*) 1.7 - 7.7 K/uL   Lymphocytes Relative 19  12 - 46 %   Lymphs Abs 2.4  0.7 - 4.0 K/uL   Monocytes Relative 8  3 - 12 %   Monocytes Absolute 1.0  0.1 - 1.0 K/uL   Eosinophils Relative 1  0 - 5 %   Eosinophils Absolute 0.1  0.0 - 0.7 K/uL   Basophils Relative 0  0 - 1 %   Basophils Absolute 0.0  0.0 - 0.1 K/uL  COMPREHENSIVE METABOLIC PANEL     Status: Abnormal   Collection Time    05/07/13  5:41 AM      Result Value Range   Sodium 135  135 - 145 mEq/L   Potassium 4.3  3.5 - 5.1 mEq/L   Chloride 101  96 - 112 mEq/L   CO2 28  19 - 32 mEq/L   Glucose, Bld 86  70 - 99 mg/dL   BUN 11  6 - 23 mg/dL    Creatinine, Ser 7.82  0.50 - 1.10 mg/dL   Calcium 7.6 (*) 8.4 - 10.5 mg/dL   Total Protein 4.5 (*) 6.0 - 8.3 g/dL   Albumin 1.5 (*) 3.5 - 5.2 g/dL   AST 24  0 - 37 U/L   ALT 14  0 - 35 U/L   Alkaline Phosphatase 81  39 - 117 U/L   Total Bilirubin 0.1 (*) 0.3 - 1.2 mg/dL   GFR calc non Af Amer >90  >90 mL/min   GFR calc Af Amer >90  >90 mL/min  MAGNESIUM     Status: Abnormal   Collection Time    05/07/13  5:41 AM      Result Value  Range   Magnesium 5.8 (*) 1.5 - 2.5 mg/dL    Studies/Results: @RISRSLT24 @  Medications: Reviewed   @PROBHOSP @  1.  Preeclampsia.  BP is improving  Patient has diuresed  Still with increased volume on exam.   I have written for additional lasix.  Follow output.  Patient would benefit from IV diuresis priort to d/c  2.  HTN  BP is still high Follow with diuresis.  Will increase b blocker to 200 bid.  3.  MR  Will need to be followed by echo after d/c.  May iprove with diuressis, bp control    LOS: 6 days   Dietrich Pates 05/07/2013, 3:25 PM

## 2013-05-07 NOTE — Lactation Note (Addendum)
This note was copied from the chart of Rachael Kayna Suppa. Lactation Consultation Note    Follow up consult with this mom and dad, of a NICU baby, now 40 hours post partum. Mom has been pumping and is expressing good amounts (5 mls ) of colostrum, every 3 hours. I reviewed teaching from the NICU booklet on providing breast milk for your baby. I reviewed hand expression with mom, and mom returned  demonstrated with good technique.    I encouraged mom to call WIC and let them add her baby, and ask about a DEP. Dad very helpful and involved. I encouraged mom to ask about when she can hold her baby, and the importance of skin to skin. i will follow this family in the NICU.  Patient Name: Rachael Santiago [id number] Date: 05/07/2013 Reason for consult: Follow-up assessment;NICU baby   Maternal Data    Feeding    LATCH Score/Interventions                      Lactation Tools Discussed/Used Tools: Pump Breast pump type: Double-Electric Breast Pump WIC Program: Yes Pump Review: Setup, frequency, and cleaning;Milk Storage;Other (comment) (premie setting, hand expression) Initiated by:: bedside nurse tech started at 7 hours post partum Date initiated:: 05/06/13   Consult Status Consult Status: Follow-up Date: 05/08/13 Follow-up type: In-patient    Alfred Levins 05/07/2013, 1:26 PM

## 2013-05-07 NOTE — Progress Notes (Signed)
Patient ID: Rachael Santiago, female   DOB: [birth year], 32 y.o.   MRN: [id number] #2 afebrile BP acceptable Excellent diuresis Will D/C magnesium sulfate Labs are normal

## 2013-05-07 NOTE — Progress Notes (Signed)
UR chart review completed.  

## 2013-05-08 LAB — BASIC METABOLIC PANEL
BUN: 10 mg/dL (ref 6–23)
CO2: 27 mEq/L (ref 19–32)
Calcium: 8.1 mg/dL — ABNORMAL LOW (ref 8.4–10.5)
Chloride: 104 mEq/L (ref 96–112)
Creatinine, Ser: 0.75 mg/dL (ref 0.50–1.10)
Glucose, Bld: 94 mg/dL (ref 70–99)

## 2013-05-08 MED ORDER — POTASSIUM CHLORIDE CRYS ER 20 MEQ PO TBCR
20.0000 meq | EXTENDED_RELEASE_TABLET | Freq: Two times a day (BID) | ORAL | Status: DC
  Administered 2013-05-08 – 2013-05-10 (×5): 20 meq via ORAL
  Filled 2013-05-08 (×5): qty 1

## 2013-05-08 MED ORDER — LABETALOL HCL 200 MG PO TABS
200.0000 mg | ORAL_TABLET | Freq: Three times a day (TID) | ORAL | Status: DC
  Administered 2013-05-08 (×3): 200 mg via ORAL
  Filled 2013-05-08 (×4): qty 1

## 2013-05-08 MED ORDER — FUROSEMIDE 40 MG PO TABS
40.0000 mg | ORAL_TABLET | Freq: Every day | ORAL | Status: AC
  Administered 2013-05-08 – 2013-05-10 (×3): 40 mg via ORAL
  Filled 2013-05-08 (×3): qty 1

## 2013-05-08 NOTE — Clinical Social Work Maternal (Signed)
Clinical Social Work Department PSYCHOSOCIAL ASSESSMENT - MATERNAL/CHILD 05/08/2013  Patient:  Mabee,Audi  Account Number:  [id number]  Admit Date:  05/01/2013  Childs Name:   Elijah Leisey    Clinical Social Worker:  Aradhya Shellenbarger, LCSW   Date/Time:  05/08/2013 11:30 AM  Date Referred:  05/08/2013   Referral source  NICU     Referred reason  NICU   Other referral source:    I:  FAMILY / HOME ENVIRONMENT Child's legal guardian:  PARENT  Guardian - Name Guardian - Age Guardian - Address  Kella Crespin [address] [place], [place] [address]  Rashawn Nielsen  same   Other household support members/support persons Other support:   MOB states they have a great support system of family and friends close by.    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  They will let NICU staff when they do.   IV  RISK FACTORS AND CURRENT PROBLEMS Current Problem:  None   Risk Factor & Current Problem Patient Issue Family Issue Risk Factor / Current Problem Comment   N N     V  SOCIAL WORK ASSESSMENT  CSW met with parents in MOB's third floor room/310 to introduce myself and complete assessment for NICU admission.  Parents were quiet, but friendly and state they are doing well.  MOB states  she is feeling somewhat better physically.  She explained that her pregnancy had been going normally until it was discovered last week that she had pre-eclampsia.  She appears to be coping well and FOB seems very involved and supportive.  They report having a great support system and the ability to get the supplies they need for baby, however, they do not have any baby items yet.  CSW informed them of [employer] as a possible resource if they have difficulty getting needed items.  They report no questions or concerns at this time and state that the NICU staff have been great about answering questions and keeping them informed.  CSW informed them of the ability to have a family conference any time and asked them not to be alarmed if NICU team calls for a family conference.  Parents state no issues with transportation.  CSW informed them of baby's eligibility for SSI and offered to assist them in completing the application.  Parents state they will discuss this and let CSW know if they are interested.  CSW informed them of ongoing support services offered by NICU CSW and gave contact information.   VI SOCIAL WORK PLAN Social Work Plan  Psychosocial Support/Ongoing Assessment of Needs   Type of pt/family education:   SSI  Ongoing support services offered by NICU CSW   If child protective services report - county:   If child protective services report - date:   Information/referral to community   resources comment:   No referrals made at this time.   Other social work plan:    

## 2013-05-08 NOTE — Progress Notes (Signed)
PROGRESS NOTE  Subjective:   Rachael Santiago is a 32 yo with preeclampsia.  S/p C-section.  She is diuresing and is improving.  She is feeling better every day.  Objective:    Vital Signs:   Temp:  [98.2 F (36.8 C)-99.1 F (37.3 C)] 98.4 F (36.9 C) (06/24 0612) Pulse Rate:  [84-98] 92 (06/24 0612) Resp:  [18-20] 18 (06/24 0612) BP: (137-194)/(89-113) 166/104 mmHg (06/24 0646) SpO2:  [96 %-98 %] 98 % (06/24 0612) Weight:  [226 lb (102.513 kg)] 226 lb (102.513 kg) (06/24 0735)  Last BM Date: 05/04/13   24-hour weight change: Weight change:   Weight trends: Filed Weights   05/06/13 0803 05/07/13 0538 05/08/13 0735  Weight: 239 lb 9.6 oz (108.682 kg) 230 lb 8 oz (104.554 kg) 226 lb (102.513 kg)    Intake/Output:  06/23 0701 - 06/24 0700 In: 312.5 [P.O.:200; I.V.:112.5] Out: 2175 [Urine:2175]     Physical Exam: BP 166/104  Pulse 92  Temp(Src) 98.4 F (36.9 C) (Oral)  Resp 18  Ht 5\' 3"  (1.6 m)  Wt 226 lb (102.513 kg)  BMI 40.04 kg/m2  SpO2 98%  LMP 10/19/2012  Breastfeeding? Unknown  General: Vital signs reviewed and noted.   Head: Normocephalic, atraumatic.  Eyes: conjunctivae/corneas clear.  EOM's intact.   Throat: normal  Neck:  normal   Lungs:    clear  Heart:  RR, soft systolic murmur  Abdomen:  S/p c-section  Extremities: 1+ edema   Neurologic: A&O X3, CN II - XII are grossly intact.   Psych: Normal     Labs: BMET:  Recent Labs  05/06/13 0500 05/07/13 0541 05/08/13 0517  NA 134* 135 137  K 4.8 4.3 4.4  CL 102 101 104  CO2 25 28 27   GLUCOSE 90 86 94  BUN 16 11 10   CREATININE 0.87 0.77 0.75  CALCIUM 7.9* 7.6* 8.1*  MG  --  5.8*  --     Liver function tests:  Recent Labs  05/06/13 0500 05/07/13 0541  AST 32 24  ALT 19 14  ALKPHOS 89 81  BILITOT 0.2* 0.1*  PROT 4.4* 4.5*  ALBUMIN 1.6* 1.5*   No results found for this basename: LIPASE, AMYLASE,  in the last 72 hours  CBC:  Recent Labs  05/06/13 0500 05/07/13 0541  WBC  13.0* 12.9*  NEUTROABS  --  9.4*  HGB 9.7* 8.9*  HCT 29.8* 26.8*  MCV 85.9 84.5  PLT 174 185    Cardiac Enzymes: No results found for this basename: CKTOTAL, CKMB, TROPONINI,  in the last 72 hours  Coagulation Studies: No results found for this basename: LABPROT, INR,  in the last 72 hours     Medications:    Infusions: . lactated ringers 50 mL/hr at 05/07/13 0319    Scheduled Medications: . furosemide  40 mg Intravenous Once  . ibuprofen  600 mg Oral Q6H  . labetalol  200 mg Oral BID  . prenatal multivitamin  1 tablet Oral Q1200  . scopolamine  1 patch Transdermal Once  . senna-docusate  2 tablet Oral QHS  . senna-docusate  2 tablet Oral QHS  . simethicone  80 mg Oral TID PC & HS    Assessment/ Plan:   :   Hypertension in pregnancy, preeclampsia, severe, antepartum She is improving.  I would DC on Lasix 40 PO QD and KCl 10 meq QD. She should take her BP at home every other day or so. I would increase her Labetalol  to 200 TID.   Follow up with Dr. Myrtis Ser ( or PA / NP)  in the office in several weeks ) sooner if needed ) .  [phone]   Disposition: OK to go home today from a cardiac standpoint.   Length of Stay: 7  Vesta Mixer, Montez Hageman., MD, [facility] 05/08/2013, 7:59 AM Office [phone] Pager [phone]

## 2013-05-08 NOTE — Progress Notes (Signed)
Patient ID: Rachael Santiago, female   DOB: [birth year], 32 y.o.   MRN: [id number] #[id number] afebrile doing well Weight down to 226 from 243 Dr. Elease Hashimoto recommends daily lasix for 1 week , oral K+ and labetalol 200 mg tid. She is ambulating, tolerating a diet,passing flatus and voiding

## 2013-05-09 LAB — CBC
MCHC: 32.9 g/dL (ref 30.0–36.0)
Platelets: 234 10*3/uL (ref 150–400)
RDW: 15.4 % (ref 11.5–15.5)
WBC: 13.5 10*3/uL — ABNORMAL HIGH (ref 4.0–10.5)

## 2013-05-09 LAB — COMPREHENSIVE METABOLIC PANEL
ALT: 32 U/L (ref 0–35)
AST: 57 U/L — ABNORMAL HIGH (ref 0–37)
Albumin: 1.9 g/dL — ABNORMAL LOW (ref 3.5–5.2)
Calcium: 9.1 mg/dL (ref 8.4–10.5)
GFR calc Af Amer: >90 mL/min (ref >90)
Potassium: 4.5 mEq/L (ref 3.5–5.1)
Sodium: 137 mEq/L (ref 135–145)
Total Protein: 5.2 g/dL — ABNORMAL LOW (ref 6.0–8.3)

## 2013-05-09 LAB — URIC ACID: Uric Acid, Serum: 7.6 mg/dL — ABNORMAL HIGH (ref 2.4–7.0)

## 2013-05-09 MED ORDER — NIFEDIPINE ER 30 MG PO TB24
30.0000 mg | ORAL_TABLET | Freq: Every day | ORAL | Status: DC
  Administered 2013-05-09 – 2013-05-10 (×2): 30 mg via ORAL
  Filled 2013-05-09 (×2): qty 1

## 2013-05-09 MED ORDER — LABETALOL HCL 300 MG PO TABS
300.0000 mg | ORAL_TABLET | Freq: Three times a day (TID) | ORAL | Status: DC
  Administered 2013-05-09 – 2013-05-10 (×4): 300 mg via ORAL
  Filled 2013-05-09 (×4): qty 1

## 2013-05-09 MED ORDER — NIFEDIPINE ER 30 MG PO TB24
30.0000 mg | ORAL_TABLET | ORAL | Status: DC
  Filled 2013-05-09: qty 1

## 2013-05-09 MED ORDER — NIFEDIPINE ER 30 MG PO TB24
30.0000 mg | ORAL_TABLET | Freq: Once | ORAL | Status: AC
  Administered 2013-05-09: 30 mg via ORAL
  Filled 2013-05-09: qty 1

## 2013-05-09 NOTE — Lactation Note (Signed)
This note was copied from the chart of [name]. Lactation Consultation Note   Follow up consult with this mom of a NICU baby, now 94 hours post partum, and 28 2/7 weeks corrected gestation. Mom pumped over 4 ounces this morning, for which I praised her for her good work. She is still in hospital due to her hypertension, and despite this is producing a great milk supply. I gave mom the paper work to loan a DEP, for when she is discharged to home.   Patient Name: [name] [id number] Date: 05/09/2013     Maternal Data    Feeding    LATCH Score/Interventions                      Lactation Tools Discussed/Used     Consult Status      Alfred Levins 05/09/2013, 4:01 PM

## 2013-05-09 NOTE — Progress Notes (Signed)
    Pt is doing well.   She diuresed 3.6 liters yesterday.  Her BP is fairly well controlled after getting Nifidipine 30 mg early this am. I will write for daily Procardia XL 30 mg a day Continue  Labetalol 200 TID and Lasix / KCl for 1 week.  She can follow up with her medical doctor and with Dr. Myrtis Ser.  We willl sign off.  Call for questions.  Vesta Mixer, Montez Hageman., MD, [facility] 05/09/2013, 7:35 AM Office - [phone] Pager [phone]

## 2013-05-09 NOTE — Progress Notes (Signed)
Patient ID: Rachael Santiago, female   DOB: Apr 08, 1981, 32 y.o.   MRN: [id number] #[id number] BP remains an issue Highest 170/114 Pt reports bad HA yesterday Will check labs Will begin procardia and increase labetalol

## 2013-05-09 NOTE — Progress Notes (Signed)
Patient's 0200 BP 160/114. Dr. Senaida Ores paged with results of patient's previous readings, and gave a 1x order for Procardia XL 30mg .

## 2013-05-10 MED ORDER — IBUPROFEN 600 MG PO TABS: 600.0000 mg | ORAL_TABLET | Freq: Four times a day (QID) | ORAL | Status: DC | PRN

## 2013-05-10 MED ORDER — POTASSIUM CHLORIDE CRYS ER 20 MEQ PO TBCR: 20.0000 meq | EXTENDED_RELEASE_TABLET | Freq: Two times a day (BID) | ORAL | Status: DC

## 2013-05-10 MED ORDER — FUROSEMIDE 40 MG PO TABS: 40.0000 mg | ORAL_TABLET | Freq: Every day | ORAL | Status: DC

## 2013-05-10 MED ORDER — OXYCODONE-ACETAMINOPHEN 5-325 MG PO TABS: 1.0000 | ORAL_TABLET | Freq: Four times a day (QID) | ORAL | Status: DC | PRN

## 2013-05-10 MED ORDER — LABETALOL HCL 300 MG PO TABS: 300.0000 mg | ORAL_TABLET | Freq: Three times a day (TID) | ORAL | Status: DC

## 2013-05-10 MED ORDER — NIFEDIPINE ER 30 MG PO TB24: 30.0000 mg | ORAL_TABLET | Freq: Every day | ORAL | Status: DC

## 2013-05-10 NOTE — Progress Notes (Signed)
Pt is discharged in the care of friend. Downstairs per ambulatory.Stable. Denies pain, blurred vision, headache,or discomfort. Stable. Spirits good . Infant to remain in [place]. Emotinal support given to  Pt about infants status. Pt verbalized fears. Stable.Honeycomb dressing is in place.

## 2013-05-10 NOTE — Discharge Summary (Signed)
NAMEADDISEN, CHAPPELLE NO.:  [id number]  MEDICAL RECORD NO.:  [id number]  LOCATION:  9310                          FACILITY:  [facility]  PHYSICIAN:  Malachi Pro. Ambrose Mantle, M.D. DATE OF BIRTH:  07/23/1981  DATE OF ADMISSION:  05/01/2013 DATE OF DISCHARGE:  05/10/2013                              DISCHARGE SUMMARY   A 32 year old black female, para 0, gravida 1, admitted at 27 weeks and 1 day gestation because of elevated blood pressure.  She was seen in the office.  Blood pressure 150 to 158/100 but she was found to have 4+ proteinuria.  She came to the hospital and blood pressures were higher at 180/102.  She was given intravenous doses of labetalol to lower her blood pressure.  She was admitted to the hospital, placed on steroids, and on May 05, 2013, was beginning to have coughing, breathing was more difficult.  She was seen in consultation by Dr. Jerral Bonito who felt like she had congestive heart failure and I proceeded to deliver the baby by C- section to avoid excessive and prolonged strain on her heart so that the congestive heart failure could be treated appropriately.  She did have an echocardiogram before delivery that showed an ejection fraction of 60- 65%.  No evidence of cardiomyopathy.  So after delivery, the patient was treated aggressively with diuretics and her blood pressure remained a problem but it gradually decreased with increasing doses of labetalol and starting Procardia and giving her daily Lasix.  At the present time, she is stable and ready for discharge.  LABORATORY DATA:  Showed multiple tests of liver functions which were all relatively normal except the last AST was 57 on May 09, 2013.  Her platelet counts remained stable throughout.  Her creatinine remained normal.  Initial hemoglobin was 9, final one was 9.3.  At the time of discharge, she was discharged on multiple medications including Motrin 600 mg 30 tablets, 1 every 6 hours as needed  for pain, Percocet 5/325, 30 tablets, 1 every 6 hours as needed for pain, K-Dur 20 mEq 1 twice a day, Lasix 40 mg 1 once a day, Procardia XL 30 mg 1 by mouth every day, labetalol 300 mg by mouth 3 times a day.  She is advised to return to see me in 4 days and return to see Dr. Myrtis Ser in 1 week for followup examination of her heart.  She is also advised to call if she has severe headache, severe pain in her upper abdomen.  DISCHARGE DIAGNOSES:  Intrauterine pregnancy, 27 weeks 5 days, delivered by C-section, preeclampsia with congestive heart failure.  OPERATION:  Low-transverse cervical C-section.  FINAL CONDITION:  Improved.She is given a discharge instruction booklet nand an after visit summary.      Malachi Pro. Ambrose Mantle, M.D.     TFH/MEDQ  D:  05/10/2013  T:  05/10/2013  Job:  [id number]

## 2013-05-10 NOTE — Progress Notes (Signed)
Patient ID: Rachael Santiago, female   DOB: 1981/09/03, 32 y.o.   MRN: [id number] #[id number] afebrile All BP readings are acceptable For D/c

## 2013-05-10 NOTE — Lactation Note (Signed)
This note was copied from the chart of Rachael Alahni Varone. Lactation Consultation Note  Follow up consult with this mom , now 5 days post partum, and baby in NICU now 28 3/7 corrected gestation.Mom is being discharged to home today. I loaned mom a DEP. She has a call into [facility] to apply. Mom has a great milk supply - pumping 8 ounces at a time. I advised mom to pump until she stoops dripping, up to 30 minutes every 3 hours. I will follow this family in the NICU.  Patient Name: Rachael Santiago [id number] Date: 05/10/2013 Reason for consult: Follow-up assessment;NICU baby   Maternal Data    Feeding    LATCH Score/Interventions                      Lactation Tools Discussed/Used WIC Program:  (mom called to make appt to apply for [facility]) Pump Review: Setup, frequency, and cleaning   Consult Status Consult Status: PRN Follow-up type: Other (comment) (in NICU)    Alfred Levins 05/10/2013, 10:07 AM

## 2013-05-14 ENCOUNTER — Ambulatory Visit: Payer: Self-pay

## 2013-05-14 NOTE — Lactation Note (Signed)
This note was copied from the chart of Rachael Santiago. Lactation Consultation Note    Follow up consult with this mom of a now 17 day old baby, 29 weeks corrected gestation today. Mom is pumping 12 ounces every 3 hours, and her breast fullness wakes her up. She is still not feeling "great", since she had the baby, but stated she is doing better. I encouraged mom to continue self care, and to cll for any questions/concerns.  Patient Name: Rachael Blessin Kanno [id number] Date: 05/14/2013 Reason for consult: Follow-up assessment;NICU baby   Maternal Data    Feeding    LATCH Score/Interventions                      Lactation Tools Discussed/Used     Consult Status Consult Status: PRN Follow-up type:  (in NICU)    Alfred Levins 05/14/2013, 2:25 PM

## 2013-05-15 ENCOUNTER — Ambulatory Visit (INDEPENDENT_AMBULATORY_CARE_PROVIDER_SITE_OTHER): Payer: Medicaid Other | Admitting: Physician Assistant

## 2013-05-15 ENCOUNTER — Encounter: Payer: Self-pay | Admitting: Physician Assistant

## 2013-05-15 ENCOUNTER — Telehealth: Payer: Self-pay | Admitting: *Deleted

## 2013-05-15 VITALS — BP 126/91 | HR 89 | Ht 63.0 in | Wt 197.0 lb

## 2013-05-15 DIAGNOSIS — I059 Rheumatic mitral valve disease, unspecified: Secondary | ICD-10-CM

## 2013-05-15 DIAGNOSIS — I10 Essential (primary) hypertension: Secondary | ICD-10-CM

## 2013-05-15 DIAGNOSIS — I5032 Chronic diastolic (congestive) heart failure: Secondary | ICD-10-CM

## 2013-05-15 DIAGNOSIS — I340 Nonrheumatic mitral (valve) insufficiency: Secondary | ICD-10-CM

## 2013-05-15 LAB — BASIC METABOLIC PANEL
BUN: 6 mg/dL (ref 6–23)
CO2: 23 mEq/L (ref 19–32)
Chloride: 107 mEq/L (ref 96–112)
Glucose, Bld: 96 mg/dL (ref 70–99)
Potassium: 3.4 mEq/L — ABNORMAL LOW (ref 3.5–5.1)

## 2013-05-15 MED ORDER — POTASSIUM CHLORIDE CRYS ER 20 MEQ PO TBCR: 20.0000 meq | EXTENDED_RELEASE_TABLET | Freq: Two times a day (BID) | ORAL | Status: DC

## 2013-05-15 NOTE — Telephone Encounter (Signed)
lmom to take K+ 40 meq tomorrow 7/2 then stop, but no more lasix as discussed at today's ov w/PA. Any questions please call [phone].

## 2013-05-15 NOTE — Progress Notes (Signed)
[address] [place], [place]  [address] Phone: [phone] Fax:  [phone]  Date:  05/15/2013   ID:  Rachael Santiago, DOB 03-Jul-1981, MRN [id number]  PCP:  No primary provider on file.  Cardiologist:  Dr. Zackery Barefoot     History of Present Illness: Rachael Santiago is a 32 y.o. female who returns for f/u after a recent admission to the hospital.  She was admitted 6/17-6/26. Patient was at 27 weeks and 1 day gestation and developed uncontrolled BP and 4+ proteinuria. She was admitted with preeclampsia. She developed congestive heart failure and was seen by Dr. Myrtis Ser. The fetus was delivered by C-section.  Echocardiogram 05/05/13: EF 55-60%, normal wall motion, moderate MR, mild LAE, trivial effusion. Patient was diuresed and antihypertensives were adjusted.  Since discharge, she is doing well. She denies chest pain, shortness of breath, syncope, orthopnea, PND or edema. Her baby is still in the NICU and will need to be there until September.  Labs (6/14):  K 4.5, Cr 0.63, ALT 32, Alb 1.9, Hgb 9.3, TSH 5.134  Wt Readings from Last 3 Encounters:  05/15/13 197 lb (89.359 kg)  05/10/13 212 lb 12 oz (96.503 kg)  05/10/13 212 lb 12 oz (96.503 kg)     Past Medical History  Diagnosis Date  . Medical history non-contributory   . Ovarian cyst   . Antepartum mild preeclampsia 05/03/2013  . Hypertension in pregnancy, preeclampsia, severe, antepartum 05/03/2013  . Chronic diastolic CHF (congestive heart failure)     in setting of pre-eclampsia => Echocardiogram 05/05/13: EF 55-60%, normal wall motion, moderate MR, mild LAE, trivial effusion    Current Outpatient Prescriptions  Medication Sig Dispense Refill  . famotidine-calcium carbonate-magnesium hydroxide (PEPCID COMPLETE) 10-800-165 MG CHEW Chew 2 tablets by mouth 2 (two) times daily as needed.  60 tablet  4  . furosemide (LASIX) 40 MG tablet Take 1 tablet (40 mg total) by mouth daily.  7 tablet  0  . ibuprofen  (ADVIL,MOTRIN) 600 MG tablet Take 1 tablet (600 mg total) by mouth every 6 (six) hours as needed for pain.  30 tablet  0  . oxyCODONE-acetaminophen (PERCOCET/ROXICET) 5-325 MG per tablet Take 1 tablet by mouth every 6 (six) hours as needed.  30 tablet  0  . Prenatal Vit-Fe Fumarate-FA (MULTIVITAMIN-PRENATAL) 27-0.8 MG TABS Take 1 tablet by mouth daily.      Marland Kitchen labetalol (NORMODYNE) 300 MG tablet Take 1 tablet (300 mg total) by mouth 3 (three) times daily.  60 tablet  0  . NIFEdipine (PROCARDIA-XL/ADALAT CC) 30 MG 24 hr tablet Take 1 tablet (30 mg total) by mouth daily.  20 tablet  0  . potassium chloride SA (K-DUR,KLOR-CON) 20 MEQ tablet Take 1 tablet (20 mEq total) by mouth 2 (two) times daily.  20 tablet  0   No current facility-administered medications for this visit.    Allergies:   No Known Allergies  Social History:  The patient  reports that she has never smoked. She has never used smokeless tobacco. She reports that she does not drink alcohol or use illicit drugs.   ROS:  Please see the history of present illness.    All other systems reviewed and negative.   PHYSICAL EXAM: VS:  BP 126/91  Pulse 89  Ht 5\' 3"  (1.6 m)  Wt 197 lb (89.359 kg)  BMI 34.91 kg/m2  LMP 10/19/2012 Well nourished, well developed, in no acute distress HEENT: normal Neck: no JVD Cardiac:  normal S1,  S2; RRR; no murmur Lungs:  clear to auscultation bilaterally, no wheezing, rhonchi or rales Abd: soft, nontender, no hepatomegaly Ext: no edema Skin: warm and dry Neuro:  CNs 2-12 intact, no focal abnormalities noted  EKG:  NSR, HR 89     ASSESSMENT AND PLAN:  1. Diastolic CHF:  In setting of pre-eclampsia.  Volume stable.  She is taking her last dose of Lasix and K+ today.  We discussed weighing herself and to call if weight going up or she is having more dyspnea or edema.  Check BMET today. 2. Hypertension:  Better controlled.  Continue current Rx.  Continue to monitor. 3. Mitral Regurgitation:  No  murmur on exam.  Likely was related to stretching of mitral annulus from volume overload.   4. Disposition:  F/u with Dr. Zackery Barefoot in 4-6 weeks.   Signed, Tereso Newcomer, PA-C  05/15/2013 9:32 AM

## 2013-05-15 NOTE — Patient Instructions (Addendum)
TODAY SHOULD BE THE LAST DOSE OF YOUR LASIX AND POTASSIUM   LAB TODAY; BMET  PLEASE FOLLOW UP WITH DR. KATZ IN ABOUT 6 WEEKS

## 2013-05-15 NOTE — Telephone Encounter (Signed)
Message copied by Tarri Fuller on Tue May 15, 2013  5:56 PM ------      Message from: [place], [place] T      Created: Tue May 15, 2013  4:58 PM       K+ low       Last dose of Lasix should have been today.        Take K+ 40 mEq po x 1 tomorrow then stop      Tereso Newcomer, PA-C        05/15/2013 4:58 PM ------

## 2013-06-01 ENCOUNTER — Ambulatory Visit: Payer: Self-pay

## 2013-06-01 NOTE — Lactation Note (Signed)
This note was copied from the chart of Rachael Ludivina Guymon. Lactation Consultation Note   Brief follow up consult with this mom of a NICU baby, now 42 weeks old, and 31 4/[redacted] weeks gestation. Mom is trying to pump every 3 hours, but reports that even after 30 minutes, her right breast is still actively dripping. I suggested  mom to pump this breast longer, if she is not sore, just to see how long it will take to stop dripping, and soften.. I also asked mom to log her pumping for the next 3 days, so I can see how much milk she is making. Mom may not need to pump 8 times a day, if she is making an extroidinary volume of milk. I will follow this mom and baby in the nICU.  Patient Name: Rachael Santiago [id number] Date: 06/01/2013 Reason for consult: Follow-up assessment;NICU baby   Maternal Data    Feeding    LATCH Score/Interventions                      Lactation Tools Discussed/Used     Consult Status Consult Status: PRN Follow-up type:  (in NICU)    Rachael Santiago 06/01/2013, 3:14 PM

## 2013-06-24 ENCOUNTER — Encounter: Payer: Self-pay | Admitting: Cardiology

## 2013-06-24 DIAGNOSIS — I5032 Chronic diastolic (congestive) heart failure: Secondary | ICD-10-CM | POA: Insufficient documentation

## 2013-06-28 ENCOUNTER — Ambulatory Visit: Payer: Medicaid Other | Admitting: Cardiology

## 2013-08-10 ENCOUNTER — Ambulatory Visit (INDEPENDENT_AMBULATORY_CARE_PROVIDER_SITE_OTHER): Payer: Medicaid Other | Admitting: Cardiology

## 2013-08-10 ENCOUNTER — Encounter: Payer: Self-pay | Admitting: Cardiology

## 2013-08-10 VITALS — BP 132/84 | HR 89 | Ht 63.0 in | Wt 184.0 lb

## 2013-08-10 DIAGNOSIS — O1410 Severe pre-eclampsia, unspecified trimester: Secondary | ICD-10-CM

## 2013-08-10 DIAGNOSIS — I509 Heart failure, unspecified: Secondary | ICD-10-CM

## 2013-08-10 DIAGNOSIS — I313 Pericardial effusion (noninflammatory): Secondary | ICD-10-CM

## 2013-08-10 DIAGNOSIS — I3139 Other pericardial effusion (noninflammatory): Secondary | ICD-10-CM

## 2013-08-10 DIAGNOSIS — I5032 Chronic diastolic (congestive) heart failure: Secondary | ICD-10-CM

## 2013-08-10 DIAGNOSIS — I059 Rheumatic mitral valve disease, unspecified: Secondary | ICD-10-CM

## 2013-08-10 DIAGNOSIS — I319 Disease of pericardium, unspecified: Secondary | ICD-10-CM

## 2013-08-10 DIAGNOSIS — I340 Nonrheumatic mitral (valve) insufficiency: Secondary | ICD-10-CM

## 2013-08-10 DIAGNOSIS — O1413 Severe pre-eclampsia, third trimester: Secondary | ICD-10-CM

## 2013-08-10 NOTE — Assessment & Plan Note (Signed)
Blood pressure stable. No further workup.

## 2013-08-10 NOTE — Progress Notes (Signed)
   HPI  Patient is here to followup diastolic heart failure that occurred with preeclampsia. The patient was developing congestive heart failure. Her baby had to be delivered early by C-section. Fortunately the baby has stabilize. She is doing well. She's not having any chest pain or shortness of breath. Her ejection fraction was 55-60%. There was moderate mitral regurgitation.  No Known Allergies  Current Outpatient Prescriptions  Medication Sig Dispense Refill  . Prenatal Vit-Fe Fumarate-FA (MULTIVITAMIN-PRENATAL) 27-0.8 MG TABS Take 1 tablet by mouth daily.       No current facility-administered medications for this visit.    History   Social History  . Marital Status: Married    Spouse Name: N/A    Number of Children: N/A  . Years of Education: N/A   Occupational History  . Not on file.   Social History Main Topics  . Smoking status: Never Smoker   . Smokeless tobacco: Never Used  . Alcohol Use: No  . Drug Use: No  . Sexual Activity: Yes    Birth Control/ Protection: None   Other Topics Concern  . Not on file   Social History Narrative  . No narrative on file    History reviewed. No pertinent family history.  Past Medical History  Diagnosis Date  . Medical history non-contributory   . Ovarian cyst   . Antepartum mild preeclampsia 05/03/2013  . Hypertension in pregnancy, preeclampsia, severe, antepartum 05/03/2013  . Chronic diastolic CHF (congestive heart failure)     in setting of pre-eclampsia => Echocardiogram 05/05/13: EF 55-60%, normal wall motion, moderate MR, mild LAE, trivial effusion    Past Surgical History  Procedure Laterality Date  . Ovarian cyst surgery    . Cesarean section N/A 05/05/2013    Procedure: Primary cesarean section with delivery of baby boy at 1731.  ;  Surgeon: Bing Plume, MD;  Location: [facility]S;  Service: Obstetrics;  Laterality: N/A;    Patient Active Problem List   Diagnosis Date Noted  . Chronic diastolic CHF (congestive  heart failure)   . Mitral regurgitation 05/05/2013  . Pericardial effusion 05/05/2013  . Hypertension in pregnancy, preeclampsia, severe, antepartum 05/03/2013    ROS   Patient denies fever, chills, headache, sweats, rash, change in vision, change in hearing, chest pain, cough, nausea vomiting, urinary symptoms. All other systems are reviewed and are negative.  PHYSICAL EXAM   Patient is oriented to person time and place. Affect is normal. Lungs are clear. Respiratory effort is nonlabored. Cardiac exam reveals S1 and S2. There no clicks or significant murmurs. The abdomen is soft. There is no peripheral edema.  Filed Vitals:   08/10/13 1412  BP: 132/84  Pulse: 89  Height: 5\' 3"  (1.6 m)  Weight: 184 lb (83.462 kg)     ASSESSMENT & PLAN

## 2013-08-10 NOTE — Assessment & Plan Note (Signed)
Patient had moderate mitral regurgitation at the time of her event in June, 2014. Most likely was secondary to her volume status with her acute heart failure. She had significant hypertension. However repeat echo is now needed to be sure that she does not have residual significant mitral regurgitation.

## 2013-08-10 NOTE — Assessment & Plan Note (Signed)
The patient CHF occurred with preeclampsia. She needs a followup echo.

## 2013-08-10 NOTE — Assessment & Plan Note (Signed)
Patient will need a followup 2-D echo to be sure that her effusion is gone.

## 2013-08-10 NOTE — Patient Instructions (Addendum)
**Note De-identified Rachael Santiago Obfuscation** Your physician has requested that you have an echocardiogram. Echocardiography is a painless test that uses sound waves to create images of your heart. It provides your doctor with information about the size and shape of your heart and how well your heart's chambers and valves are working. This procedure takes approximately one hour. There are no restrictions for this procedure.  Your physician recommends that you continue on your current medications as directed. Please refer to the Current Medication list given to you today.  Your physician wants you to follow-up in: 1 year. You will receive a reminder letter in the mail two months in advance. If you don't receive a letter, please call our office to schedule the follow-up appointment.  

## 2013-09-07 ENCOUNTER — Telehealth: Payer: Self-pay | Admitting: Cardiology

## 2013-09-07 NOTE — Telephone Encounter (Signed)
Please advise 

## 2013-09-07 NOTE — Telephone Encounter (Signed)
New problem    Pt wants to get the implant for birth control. She has heart issues. Please advise.

## 2013-09-10 ENCOUNTER — Other Ambulatory Visit (HOSPITAL_COMMUNITY): Payer: Self-pay | Admitting: Cardiology

## 2013-09-10 ENCOUNTER — Ambulatory Visit (HOSPITAL_COMMUNITY): Payer: Medicaid Other | Attending: Cardiovascular Disease | Admitting: Radiology

## 2013-09-10 DIAGNOSIS — I340 Nonrheumatic mitral (valve) insufficiency: Secondary | ICD-10-CM

## 2013-09-10 DIAGNOSIS — I059 Rheumatic mitral valve disease, unspecified: Secondary | ICD-10-CM | POA: Insufficient documentation

## 2013-09-10 DIAGNOSIS — I5032 Chronic diastolic (congestive) heart failure: Secondary | ICD-10-CM

## 2013-09-10 DIAGNOSIS — I319 Disease of pericardium, unspecified: Secondary | ICD-10-CM | POA: Insufficient documentation

## 2013-09-10 DIAGNOSIS — O1413 Severe pre-eclampsia, third trimester: Secondary | ICD-10-CM

## 2013-09-10 NOTE — Progress Notes (Signed)
Echocardiogram performed.  

## 2013-09-11 ENCOUNTER — Telehealth: Payer: Self-pay | Admitting: Cardiology

## 2013-09-11 NOTE — Telephone Encounter (Signed)
Is it ok for pt to have implanted birth control? Please advise.

## 2013-09-11 NOTE — Telephone Encounter (Signed)
New problem    Allison/[facility] stated she called last week and left a message and havent heard anything. When I asked her what the message was she stated she had already given it last week and wouldn't comply with it again. Please call Revonda Standard.

## 2013-09-14 NOTE — Telephone Encounter (Signed)
It is okay for patient to have implanted birth control

## 2013-09-14 NOTE — Telephone Encounter (Signed)
**Note De-Identified Rachael Santiago** Rachael Santiago is advised that per Dr Myrtis Ser the pt may have implanted birth control, she verbalized understanding. Also, a copy of this note has been faxed to Dr. Ellyn Hack, per Allison's request, to [phone].

## 2013-09-16 ENCOUNTER — Encounter: Payer: Self-pay | Admitting: Cardiology

## 2013-09-16 DIAGNOSIS — R9430 Abnormal result of cardiovascular function study, unspecified: Secondary | ICD-10-CM | POA: Insufficient documentation

## 2014-02-06 ENCOUNTER — Ambulatory Visit: Payer: Medicaid Other | Admitting: Cardiology

## 2014-02-22 ENCOUNTER — Ambulatory Visit (INDEPENDENT_AMBULATORY_CARE_PROVIDER_SITE_OTHER): Payer: No Typology Code available for payment source | Admitting: Cardiology

## 2014-02-22 ENCOUNTER — Encounter: Payer: Self-pay | Admitting: Cardiology

## 2014-02-22 VITALS — BP 103/67 | HR 66 | Ht 63.0 in | Wt 199.0 lb

## 2014-02-22 DIAGNOSIS — R9430 Abnormal result of cardiovascular function study, unspecified: Secondary | ICD-10-CM

## 2014-02-22 DIAGNOSIS — R0989 Other specified symptoms and signs involving the circulatory and respiratory systems: Secondary | ICD-10-CM

## 2014-02-22 DIAGNOSIS — I5032 Chronic diastolic (congestive) heart failure: Secondary | ICD-10-CM

## 2014-02-22 DIAGNOSIS — I509 Heart failure, unspecified: Secondary | ICD-10-CM

## 2014-02-22 DIAGNOSIS — I059 Rheumatic mitral valve disease, unspecified: Secondary | ICD-10-CM

## 2014-02-22 DIAGNOSIS — I340 Nonrheumatic mitral (valve) insufficiency: Secondary | ICD-10-CM

## 2014-02-22 DIAGNOSIS — O1410 Severe pre-eclampsia, unspecified trimester: Secondary | ICD-10-CM

## 2014-02-22 NOTE — Assessment & Plan Note (Signed)
She is doing well. She is not hypertensive at this time. Her GYN doctors will no more about the approach to this issue than me.

## 2014-02-22 NOTE — Assessment & Plan Note (Signed)
She had only minimal mitral regurgitation and followup. I haven't suggested 2 or 3 year followup.

## 2014-02-22 NOTE — Patient Instructions (Signed)
**Note De-Identified Staria Birkhead Obfuscation** Your physician recommends that you continue on your current medications as directed. Please refer to the Current Medication list given to you today.  Your physician wants you to follow-up in: 2 to 3 years. You will receive a reminder letter in the mail two months in advance. If you don't receive a letter, please call our office to schedule the follow-up appointment.

## 2014-02-22 NOTE — Progress Notes (Signed)
Patient ID: Rachael Santiago, female   DOB: 06/06/1981, 33 y.o.   MRN: [id number]    HPI  Patient is seen today to followup diastolic heart failure and mitral regurgitation that occurred with preeclampsia. She is now 9 months from her delivery. She is stable and her baby is stable. We have done a followup echo in October, 2014. This showed normal left ventricular function and only slight mitral regurgitation. She continues to do quite well.  No Known Allergies  No current outpatient prescriptions on file.   No current facility-administered medications for this visit.    History   Social History  . Marital Status: Married    Spouse Name: N/A    Number of Children: N/A  . Years of Education: N/A   Occupational History  . Not on file.   Social History Main Topics  . Smoking status: Never Smoker   . Smokeless tobacco: Never Used  . Alcohol Use: No  . Drug Use: No  . Sexual Activity: Yes    Birth Control/ Protection: None   Other Topics Concern  . Not on file   Social History Narrative  . No narrative on file    History reviewed. No pertinent family history.  Past Medical History  Diagnosis Date  . Medical history non-contributory   . Ovarian cyst   . Antepartum mild preeclampsia 05/03/2013  . Hypertension in pregnancy, preeclampsia, severe, antepartum 05/03/2013  . Chronic diastolic CHF (congestive heart failure)     in setting of pre-eclampsia => Echocardiogram 05/05/13: EF 55-60%, normal wall motion, moderate MR, mild LAE, trivial effusion  . Ejection fraction     Past Surgical History  Procedure Laterality Date  . Ovarian cyst surgery    . Cesarean section N/A 05/05/2013    Procedure: Primary cesarean section with delivery of baby boy at 64.  ;  Surgeon: Melina Schools, MD;  Location: [facility];  Service: Obstetrics;  Laterality: N/A;    Patient Active Problem List   Diagnosis Date Noted  . Ejection fraction   . Chronic diastolic CHF (congestive heart failure)    . Mitral regurgitation 05/05/2013  . Pericardial effusion 05/05/2013  . Hypertension in pregnancy, preeclampsia, severe, antepartum 05/03/2013    ROS   Patient denies fever, chills, headache, sweats, rash, change in vision, change in hearing, chest pain, cough, nausea vomiting, urinary symptoms. All other systems are reviewed and are negative.  PHYSICAL EXAM  Patient is oriented to person time and place. Affect is normal. She is overweight. There is no jugulovenous distention. Lungs are clear. Respiratory effort is nonlabored. Cardiac exam reveals S1 and S2. There no clicks or significant murmurs. The abdomen is soft. There is no peripheral edema  Filed Vitals:   02/22/14 0942  BP: 103/67  Pulse: 66  Height: 5\' 3"  (1.6 m)  Weight: 199 lb (90.266 kg)     ASSESSMENT & PLAN

## 2014-02-22 NOTE — Assessment & Plan Note (Signed)
She had volume overload in the setting of preeclampsia. She has no ongoing significant symptoms.

## 2014-02-22 NOTE — Assessment & Plan Note (Signed)
Her ejection fraction was normal throughout all of the events. I explained to her that she did not have peripartum cardiomyopathy. Therefore, there is no formal recommendation from cardiology about any future pregnancies. These decisions will be up to her GYN doctors.

## 2014-09-16 ENCOUNTER — Encounter: Payer: Self-pay | Admitting: Cardiology

## 2014-10-22 ENCOUNTER — Other Ambulatory Visit (HOSPITAL_COMMUNITY): Payer: Self-pay | Admitting: Obstetrics and Gynecology

## 2014-10-22 DIAGNOSIS — E221 Hyperprolactinemia: Secondary | ICD-10-CM

## 2014-11-01 ENCOUNTER — Ambulatory Visit (HOSPITAL_COMMUNITY)
Admission: RE | Admit: 2014-11-01 | Discharge: 2014-11-01 | Disposition: A | Discharge status: Home / Self Care | Payer: BC Managed Care – PPO | Source: Ambulatory Visit | Attending: Obstetrics and Gynecology | Admitting: Obstetrics and Gynecology

## 2014-11-01 DIAGNOSIS — E221 Hyperprolactinemia: Secondary | ICD-10-CM

## 2014-11-01 MED ORDER — GADOBENATE DIMEGLUMINE 529 MG/ML IV SOLN
20.0000 mL | Freq: Once | INTRAVENOUS | Status: AC | PRN
  Administered 2014-11-01: 18 mL via INTRAVENOUS

## 2016-06-29 IMAGING — MR MR HEAD WO/W CM
10 of 15 series · 34 of 48 positions shown · IV contrast (multihance)
Comparison: None.

CLINICAL DATA: 33-year-old female with hyperprolactinemia. Initial
encounter.

EXAM:
MRI HEAD WITHOUT AND WITH CONTRAST
TECHNIQUE: Multiplanar, multiecho pulse sequences of the brain and surrounding
structures were obtained without and with intravenous contrast.
CONTRAST:  18mL MULTIHANCE GADOBENATE DIMEGLUMINE 529 MG/ML IV SOLN

[Series 2: T1 · sagittal · 5.0mm · 0.47mm/px · 3 of 24 slices shown]
[im 1/24]
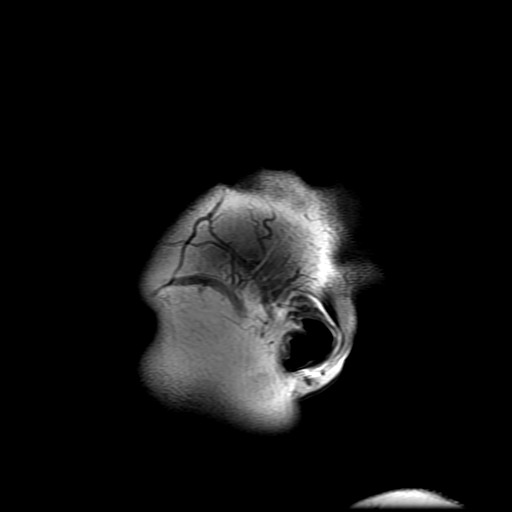
[im 12/24]
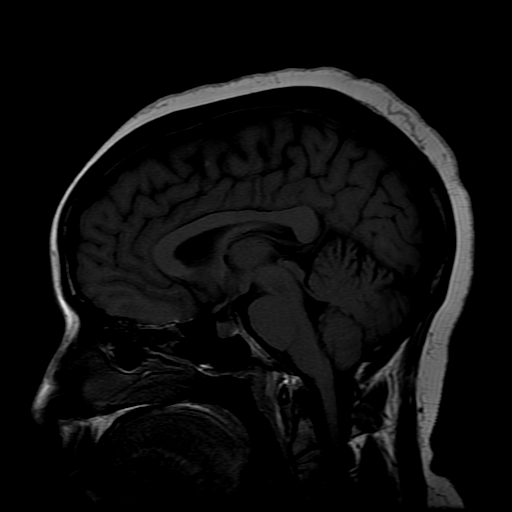
[im 24/24]
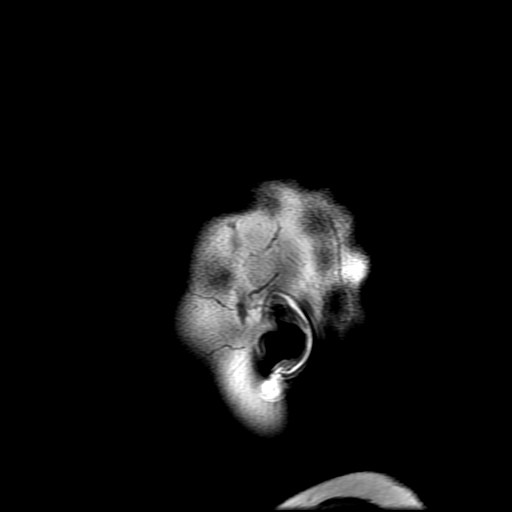

[Series 4: DWI · axial · 3.0mm · 1.09mm/px · z∈[-122,+20]mm · 9 of 98 slices shown (1 of 4)]
[im 1/98]
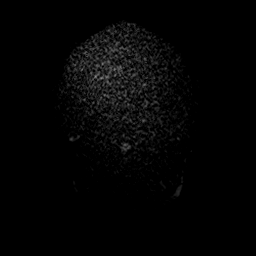
[im 13/98]
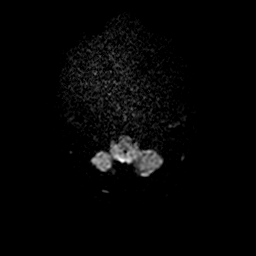
[im 25/98]
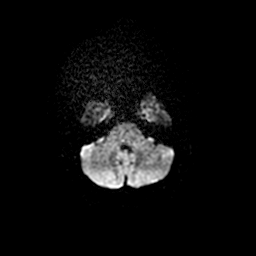
[im 37/98]
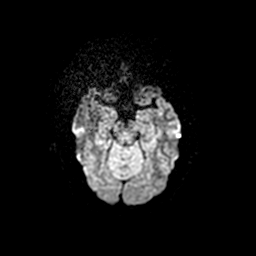
[im 49/98]
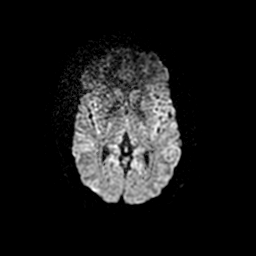
[im 61/98]
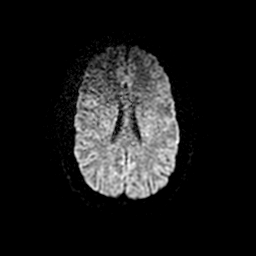
[im 73/98]
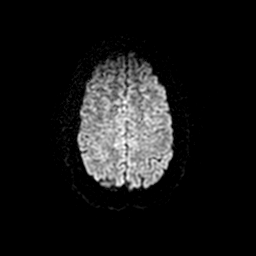
[im 85/98]
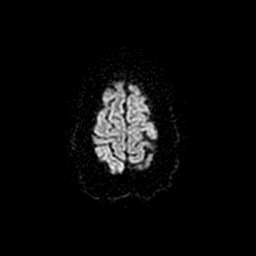
[im 98/98]
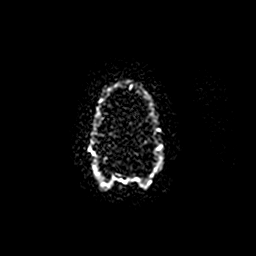

[Series 5: DWI · coronal · 5.0mm · 1.09mm/px · 6 of 64 slices shown (2 of 4)]
[im 1/64]
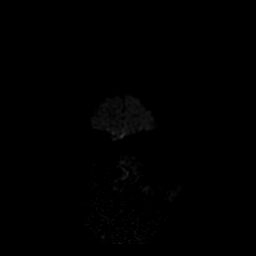
[im 13/64]
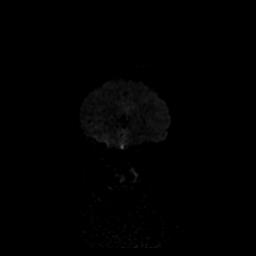
[im 26/64]
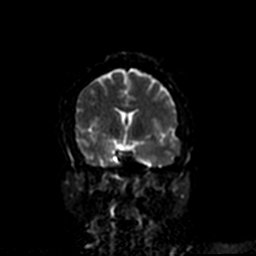
[im 38/64]
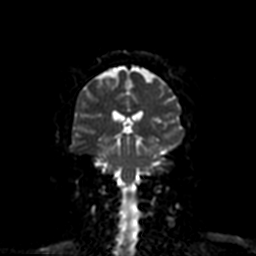
[im 51/64]
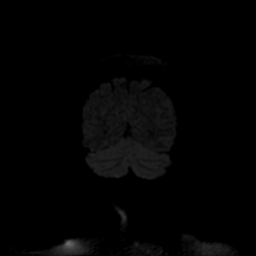
[im 64/64]
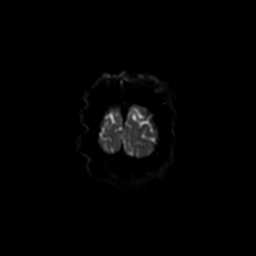

[Series 6: T2 · axial · 5.0mm · 0.43mm/px · z∈[-140,+17]mm · 2 of 24 slices shown]
[im 1/24]
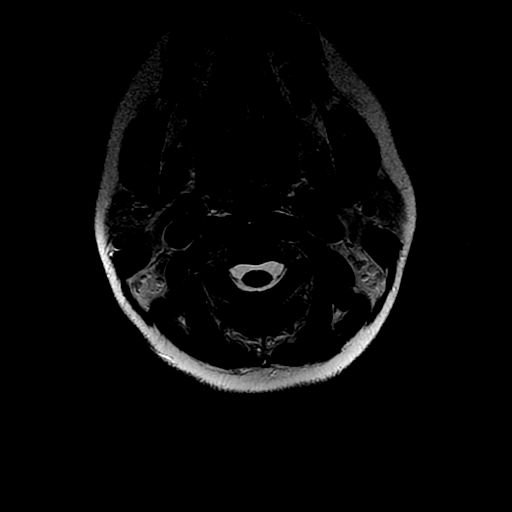
[im 24/24]
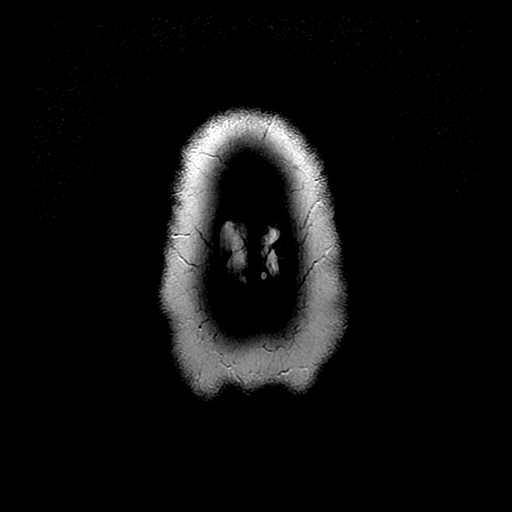

[Series 8: FLAIR · axial · 5.0mm · 0.43mm/px · z∈[-140,+17]mm · 2 of 24 slices shown]
[im 1/24]
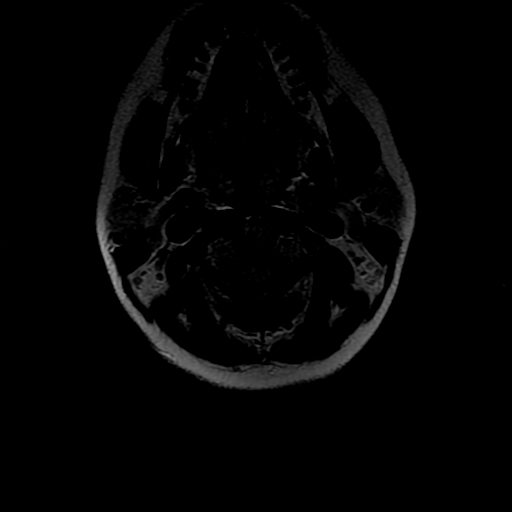
[im 24/24]
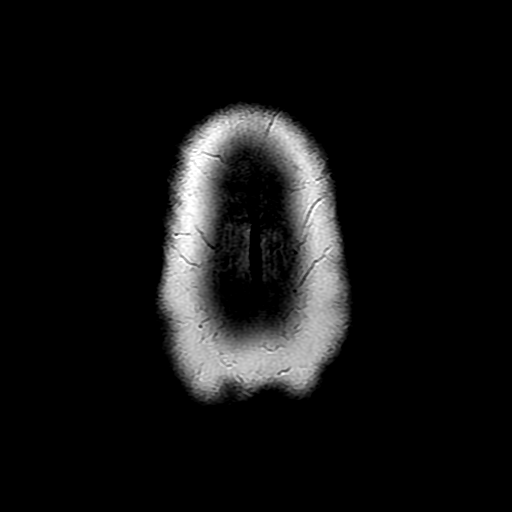

[Series 14: T1 post-contrast · coronal · 3.0mm · 0.35mm/px · 1 of 12 slices shown (1 of 3)]
[im 1/12]
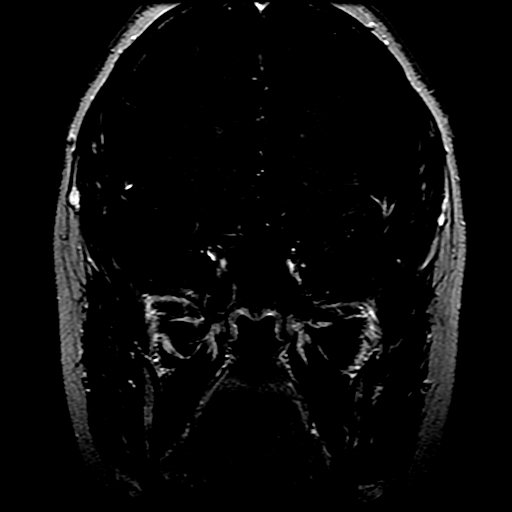

[Series 15: T1 post-contrast · sagittal · 3.0mm · 0.35mm/px · 1 of 12 slices shown (2 of 3)]
[im 1/12]
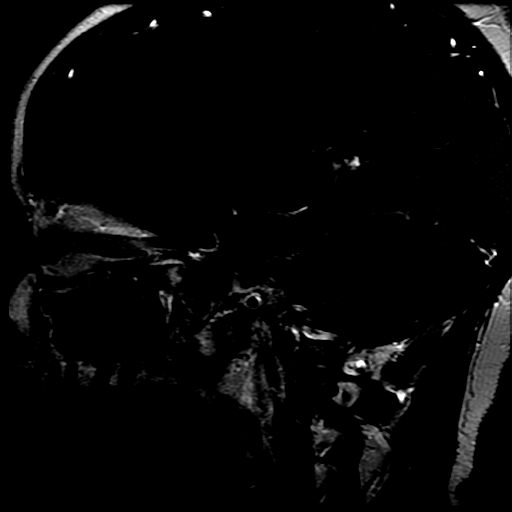

[Series 16: T1 post-contrast · coronal · 5.0mm · 0.45mm/px · 3 of 28 slices shown (3 of 3)]
[im 1/28]
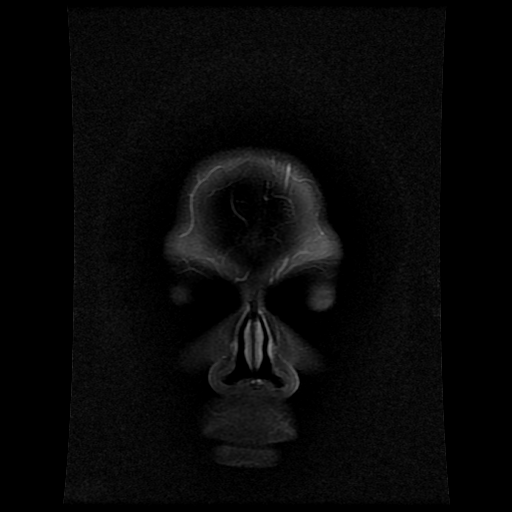
[im 14/28]
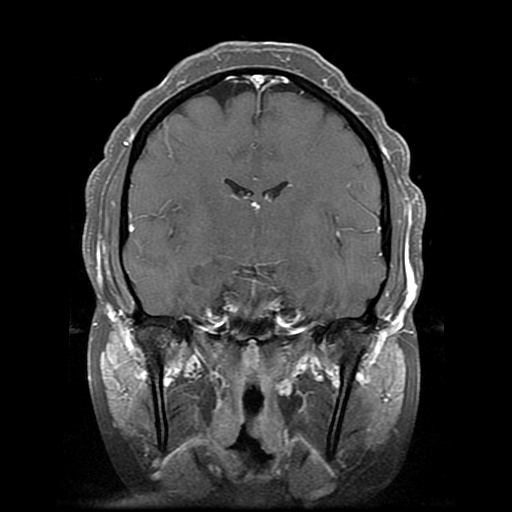
[im 28/28]
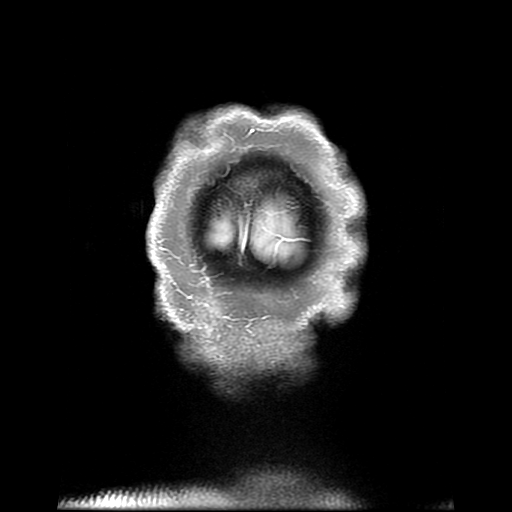

[Series 400: DWI · axial · 3.0mm · 1.09mm/px · z∈[-122,+20]mm · 4 of 49 slices shown (3 of 4)]
[im 1/49]
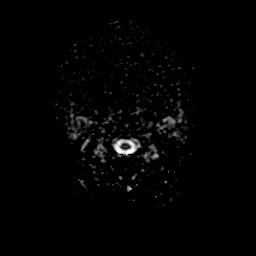
[im 17/49]
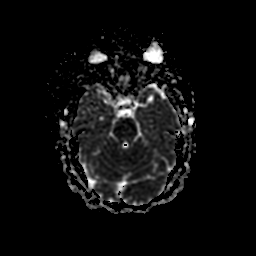
[im 33/49]
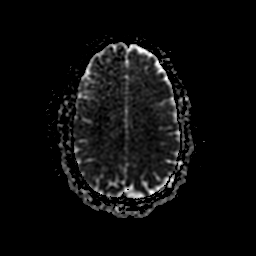
[im 49/49]
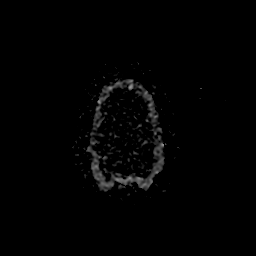

[Series 500: DWI · coronal · 5.0mm · 1.09mm/px · 3 of 32 slices shown (4 of 4)]
[im 1/32]
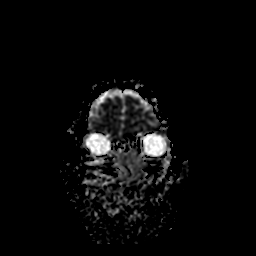
[im 16/32]
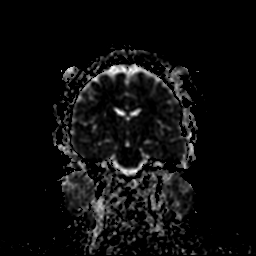
[im 32/32]
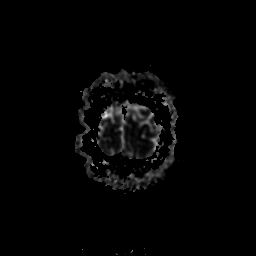

[34 of 48 positions shown; findings below may reference images not displayed]

FINDINGS: Cerebral volume is normal. Major intracranial vascular flow voids
are preserved. No restricted diffusion to suggest acute infarction.
No midline shift, mass effect, evidence of mass lesion,
ventriculomegaly, extra-axial collection or acute intracranial
hemorrhage. Cervicomedullary junction within normal limits. Negative
visualized cervical spine. Gray and white matter signal is within
normal limits throughout the brain. Excluding the pituitary region,
no abnormal enhancement identified.

Visible internal auditory structures appear normal. Trace right
mastoid fluid. Negative visualized nasopharynx. Mild paranasal sinus
mucosal thickening. Visualized orbit soft tissues are within normal
limits. Visualized bone marrow signal is within normal limits.
Visualized scalp soft tissues are within normal limits.

Dedicated pituitary imaging. Overall normal pituitary size. The
infundibulum is normal except for mild leftward deviation (series
14, image 8). No suprasellar mass or mass effect. Normal
hypothalamus. Cavernous sinus within normal limits, mild asymmetric
tortuosity of the right ICA siphon.

Dynamic and delayed post-contrast images of the pituitary do not
demonstrate a discrete pituitary mass or lesion. Overall enhancement
is mildly heterogeneous. No abnormal enhancement identified
elsewhere.
IMPRESSION: 1. No pituitary adenoma or lesion identified.
2. Negative MRI appearance of the brain.

## 2022-01-07 ENCOUNTER — Emergency Department (HOSPITAL_BASED_OUTPATIENT_CLINIC_OR_DEPARTMENT_OTHER)
Admission: EM | Admit: 2022-01-07 | Discharge: 2022-01-08 | Disposition: A | Discharge status: Home / Self Care | Payer: BLUE CROSS/BLUE SHIELD | Attending: Emergency Medicine | Admitting: Emergency Medicine

## 2022-01-07 ENCOUNTER — Other Ambulatory Visit: Payer: Self-pay

## 2022-01-07 DIAGNOSIS — R0789 Other chest pain: Secondary | ICD-10-CM

## 2022-01-07 DIAGNOSIS — M436 Torticollis: Secondary | ICD-10-CM | POA: Insufficient documentation

## 2022-01-07 DIAGNOSIS — M546 Pain in thoracic spine: Secondary | ICD-10-CM | POA: Diagnosis not present

## 2022-01-07 DIAGNOSIS — R42 Dizziness and giddiness: Secondary | ICD-10-CM | POA: Diagnosis not present

## 2022-01-07 DIAGNOSIS — M79601 Pain in right arm: Secondary | ICD-10-CM | POA: Insufficient documentation

## 2022-01-07 NOTE — ED Provider Notes (Signed)
[facility] DEPT Provider Note   CSN: [id number] Arrival date & time: 01/07/22  2018     History  Chief Complaint  Patient presents with   Torticollis   Arm Pain   Dizziness    Rachael Santiago is a 41 y.o. female.  Patient is a 41 year old female with no significant past medical history.  Patient presenting today with complaints of stiff neck, upper back pain, and right arm pain.  This has been present for the past 3 days.  She began feeling dizzy today and presents for evaluation of this.  She denies to me she is having shortness of breath, nausea, diaphoresis, or exertional symptoms.  Patient was seen in urgent care yesterday, but no tests were performed.  She was diagnosed with musculoskeletal pain and treated with meloxicam which she has not filled.  She denies any worsening with exertion.  She denies any fevers, chills, or cough.  She does say that aspirin seems to help.  Last menstrual period was 1 week ago and normal.  The history is provided by the patient.      Home Medications Prior to Admission medications   Not on File      Allergies    Patient has no known allergies.    Review of Systems   Review of Systems  All other systems reviewed and are negative.  Physical Exam Updated Vital Signs BP 118/80    Pulse (!) 57    Temp 97.9 F (36.6 C) (Temporal)    Resp 15    Ht 5\' 3"  (1.6 m)    Wt 106.6 kg    LMP 01/01/2022 (Approximate)    SpO2 99%    BMI 41.63 kg/m  Physical Exam Vitals and nursing note reviewed.  Constitutional:      General: She is not in acute distress.    Appearance: She is well-developed. She is not diaphoretic.  HENT:     Head: Normocephalic and atraumatic.  Cardiovascular:     Rate and Rhythm: Normal rate and regular rhythm.     Heart sounds: No murmur heard.   No friction rub. No gallop.  Pulmonary:     Effort: Pulmonary effort is normal. No respiratory distress.     Breath sounds: Normal breath sounds. No  wheezing.  Abdominal:     General: Bowel sounds are normal. There is no distension.     Palpations: Abdomen is soft.     Tenderness: There is no abdominal tenderness.  Musculoskeletal:        General: No swelling or tenderness. Normal range of motion.     Cervical back: Normal range of motion and neck supple.     Right lower leg: No edema.     Left lower leg: No edema.  Skin:    General: Skin is warm and dry.  Neurological:     General: No focal deficit present.     Mental Status: She is alert and oriented to person, place, and time.    ED Results / Procedures / Treatments   Labs (all labs ordered are listed, but only abnormal results are displayed) Labs Reviewed - No data to display  EKG EKG Interpretation  Date/Time:  Thursday January 07 2022 20:48:48 EST Ventricular Rate:  65 PR Interval:  168 QRS Duration: 93 QT Interval:  424 QTC Calculation: 441 R Axis:   54 Text Interpretation: Sinus rhythm Confirmed by Octaviano Glow [phone]) on 01/07/2022 10:44:24 PM  Radiology No results found.  Procedures Procedures  Medications Ordered in ED Medications - No data to display  ED Course/ Medical Decision Making/ A&P  This patient presents to the ED for concern of chest and back pain, this involves an extensive number of treatment options, and is a complaint that carries with it a high risk of complications and morbidity.  The differential diagnosis includes acute coronary syndrome, musculoskeletal etiology   Co morbidities that complicate the patient evaluation  None   Additional history obtained:  No additional history or outside records needed   Lab Tests:  I Ordered, and personally interpreted labs.  The pertinent results include: Unremarkable metabolic panel and troponin.  Her CBC does show iron deficiency anemia with a hemoglobin of 8.1.   Imaging Studies ordered:  No imaging studies obtained   Cardiac Monitoring:  The patient was maintained on a  cardiac monitor.  I personally viewed and interpreted the cardiac monitored which showed an underlying rhythm of: Sinus rhythm   Medicines ordered and prescription drug management:  No medications given  Test Considered:  No other test considered   Critical Interventions:  None   Consultations Obtained:  No consultations obtained   Problem List / ED Course:  Patient presenting with upper back/chest discomfort that seems musculoskeletal in nature.  EKG is unremarkable and troponin is negative despite greater than 24 hours of discomfort.  I do not feel as though a delta troponin is needed in this setting.  Patient obtaining relief at home after taking aspirin and I will have her continue this.  I feel that she can safely be discharged with return as needed.   Reevaluation:  After the interventions noted above, I reevaluated the patient and found that they have :stayed the same   Social Determinants of Health:  None   Dispostion:  After consideration of the diagnostic results and the patients response to treatment, I feel that the patent would benefit from discharge to home with continued use of aspirin and follow-up with her primary doctor.    Final Clinical Impression(s) / ED Diagnoses Final diagnoses:  None    Rx / DC Orders ED Discharge Orders     None         Veryl Speak, MD 01/08/22 [phone]

## 2022-01-07 NOTE — ED Triage Notes (Signed)
Pt has had stiff neck and right arm pain x 3 days, dizziness started yesterday. Was seen at urgent care yesterday but patient wants to be sure she is not having a heart attack.

## 2022-01-08 LAB — CBC WITH DIFFERENTIAL/PLATELET
Abs Immature Granulocytes: 0.02 10*3/uL (ref 0.00–0.07)
Basophils Absolute: 0 10*3/uL (ref 0.0–0.1)
Basophils Relative: 0 %
Eosinophils Absolute: 0.1 10*3/uL (ref 0.0–0.5)
Eosinophils Relative: 1 %
HCT: 28.3 % — ABNORMAL LOW (ref 36.0–46.0)
Hemoglobin: 8.1 g/dL — ABNORMAL LOW (ref 12.0–15.0)
Immature Granulocytes: 0 %
Lymphocytes Relative: 37 %
Lymphs Abs: 2.9 10*3/uL (ref 0.7–4.0)
MCH: 19.8 pg — ABNORMAL LOW (ref 26.0–34.0)
MCHC: 28.6 g/dL — ABNORMAL LOW (ref 30.0–36.0)
MCV: 69 fL — ABNORMAL LOW (ref 80.0–100.0)
Monocytes Absolute: 0.5 10*3/uL (ref 0.1–1.0)
Monocytes Relative: 7 %
Neutro Abs: 4.2 10*3/uL (ref 1.7–7.7)
Neutrophils Relative %: 55 %
Platelets: 409 10*3/uL — ABNORMAL HIGH (ref 150–400)
RBC: 4.1 MIL/uL (ref 3.87–5.11)
RDW: 21.1 % — ABNORMAL HIGH (ref 11.5–15.5)
WBC: 7.8 10*3/uL (ref 4.0–10.5)
nRBC: 0 % (ref 0.0–0.2)

## 2022-01-08 LAB — COMPREHENSIVE METABOLIC PANEL
ALT: 8 U/L (ref 0–44)
AST: 13 U/L — ABNORMAL LOW (ref 15–41)
Albumin: 3.8 g/dL (ref 3.5–5.0)
Alkaline Phosphatase: 68 U/L (ref 38–126)
Anion gap: 8 (ref 5–15)
BUN: 7 mg/dL (ref 6–20)
CO2: 25 mmol/L (ref 22–32)
Calcium: 8.6 mg/dL — ABNORMAL LOW (ref 8.9–10.3)
Chloride: 105 mmol/L (ref 98–111)
Creatinine, Ser: 0.52 mg/dL (ref 0.44–1.00)
GFR, Estimated: >60 mL/min (ref >60)
Glucose, Bld: 95 mg/dL (ref 70–99)
Potassium: 3.5 mmol/L (ref 3.5–5.1)
Sodium: 138 mmol/L (ref 135–145)
Total Bilirubin: 0.3 mg/dL (ref 0.3–1.2)
Total Protein: 6.6 g/dL (ref 6.5–8.1)

## 2022-01-08 LAB — TROPONIN I (HIGH SENSITIVITY): Troponin I (High Sensitivity): 2 ng/L (ref <18)

## 2022-01-08 LAB — HCG, SERUM, QUALITATIVE: Preg, Serum: NEGATIVE

## 2022-01-08 NOTE — Discharge Instructions (Signed)
Take ibuprofen 600 mg every 6 hours as needed for pain.  Return to the emergency department for worsening pain, difficulty breathing, or other new and concerning symptoms.

## 2023-10-18 ENCOUNTER — Telehealth: Payer: Self-pay

## 2023-10-18 NOTE — Telephone Encounter (Signed)
Rescheduled appointment per room/resource. Patient is aware of the changes made to her upcoming appointments.  

## 2023-10-20 NOTE — Progress Notes (Signed)
[facility] CONSULT NOTE  Patient Care Team: Pcp, No as PCP - General  ASSESSMENT & PLAN:  42 y.o. female with history of heavy menstrual bleeding, overweight, vitamin D deficiency being seen for iron deficiency anemia.  She has heavy menstrual bleeding.  She tolerated oral iron well but has not been consistent.  We discussed trial of oral iron again before IV iron.  She does not have profound symptoms for now.  Continue OTC gentle iron ferrochel 2 tablets twice daily with vitamin C CBC and ferritin in 3 months Follow-up as needed 1 day after labs.  All questions were answered. The patient knows to call the clinic with any problems, questions or concerns.  Melven Sartorius, MD 12/6/202410:47 AM   CHIEF COMPLAINTS/PURPOSE OF CONSULTATION:  Anemia  HISTORY OF PRESENTING ILLNESS:  Rachael Santiago 42 y.o. female is here because of anemia.  Her records showed iron deficiency anemia.  From outside lab dated 09/21/2023 creatinine 0.56 normal LFTs.  Hemoglobin 11.5 MCV 79.  RDW 16.5 platelet 337.  WBC 6.9 ferritin 5.  She bleeds about 5 days monthly. Report "big blood clots". She has to change pads every hour for 2 days and every 2 hours the other day.  Rachael Santiago had not noticed any recent bleeding such as melena, hematuria or hematochezia  She had no prior history or diagnosis of cancer. Appetite is good. She craves for chocolates during bleeding. No ice craving, dirt or steak.  She denies blood donation or received blood transfusion She has not been consistent with oral iron. She noted headaches resolved on iron.  MEDICAL HISTORY:  Past Medical History:  Diagnosis Date   Antepartum mild preeclampsia 05/03/2013   Chronic diastolic CHF (congestive heart failure) (HCC)    in setting of pre-eclampsia => Echocardiogram 05/05/13: EF 55-60%, normal wall motion, moderate MR, mild LAE, trivial effusion   Ejection fraction    Hypertension in pregnancy, preeclampsia, severe,  antepartum 05/03/2013   Medical history non-contributory    Ovarian cyst     SURGICAL HISTORY: Past Surgical History:  Procedure Laterality Date   CESAREAN SECTION N/A 05/05/2013   Procedure: Primary cesarean section with delivery of baby boy at 1731.  ;  Surgeon: Bing Plume, MD;  Location: [facility];  Service: Obstetrics;  Laterality: N/A;   OVARIAN CYST SURGERY      SOCIAL HISTORY: Social History   Socioeconomic History   Marital status: Married    Spouse name: Not on file   Number of children: Not on file   Years of education: Not on file   Highest education level: Not on file  Occupational History   Not on file  Tobacco Use   Smoking status: Never   Smokeless tobacco: Never  Substance and Sexual Activity   Alcohol use: No   Drug use: No   Sexual activity: Yes    Birth control/protection: None  Other Topics Concern   Not on file  Social History Narrative   Not on file   Social Determinants of Health   Financial Resource Strain: Not on file  Food Insecurity: Low Risk  (02/23/2023)   Received from [facility]   Hunger Vital Sign    Worried About Running Out of Food in the Last Year: Never true    Ran Out of Food in the Last Year: Never true  Transportation Needs: Not on file (02/23/2023)  Physical Activity: Not on file  Stress: Not on file  Social Connections: Not on file  Intimate Partner Violence: Not on file    FAMILY HISTORY: No family history on file.  ALLERGIES:  has No Known Allergies.  MEDICATIONS:  Current Outpatient Medications  Medication Sig Dispense Refill   ferrous sulfate 325 (65 FE) MG tablet Take 325 mg by mouth daily with breakfast.     Vitamin D, Ergocalciferol, (DRISDOL) 1.25 MG (50000 UNIT) CAPS capsule Take 50,000 Units by mouth every 7 (seven) days.     No current facility-administered medications for this visit.    REVIEW OF SYSTEMS:   All relevant systems were reviewed with the patient and are negative.   Vitals:    10/21/23 0953  BP: 119/78  Pulse: 65  Resp: 20  Temp: (!) 97.3 F (36.3 C)  SpO2: 100%   Filed Weights   10/21/23 0953  Weight: 229 lb 1.6 oz (103.9 kg)    GENERAL: alert, no distress and comfortable SKIN: skin color normal   RADIOGRAPHIC STUDIES: I have personally reviewed the radiological images as listed and agreed with the findings in the report. No results found.

## 2023-10-21 ENCOUNTER — Inpatient Hospital Stay: Payer: 59

## 2023-10-21 VITALS — BP 119/78 | HR 65 | Temp 97.3°F | Resp 20 | Wt 229.1 lb

## 2023-10-21 DIAGNOSIS — E559 Vitamin D deficiency, unspecified: Secondary | ICD-10-CM | POA: Diagnosis not present

## 2023-10-21 DIAGNOSIS — D500 Iron deficiency anemia secondary to blood loss (chronic): Secondary | ICD-10-CM | POA: Diagnosis present

## 2023-10-21 DIAGNOSIS — N920 Excessive and frequent menstruation with regular cycle: Secondary | ICD-10-CM | POA: Diagnosis present

## 2023-10-25 ENCOUNTER — Other Ambulatory Visit: Payer: BLUE CROSS/BLUE SHIELD

## 2024-02-15 ENCOUNTER — Other Ambulatory Visit: Payer: Self-pay | Admitting: Obstetrics and Gynecology

## 2024-02-16 LAB — SURGICAL PATHOLOGY
